# Patient Record
Sex: Male | Born: 1985 | Race: Black or African American | Hispanic: No | Marital: Single | Smoking: Current some day smoker
Health system: Southern US, Community
[De-identification: ages and names within clinical notes are randomized; demographics above are authoritative.]

## PROBLEM LIST (undated history)

## (undated) DIAGNOSIS — B20 Human immunodeficiency virus [HIV] disease: Secondary | ICD-10-CM

## (undated) HISTORY — PX: APPENDECTOMY: SHX54

---
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
## 2004-10-16 ENCOUNTER — Emergency Department (HOSPITAL_COMMUNITY): Admission: EM | Admit: 2004-10-16 | Discharge: 2004-10-16 | Payer: Self-pay | Admitting: Emergency Medicine

## 2006-02-11 ENCOUNTER — Encounter: Payer: Self-pay | Admitting: Emergency Medicine

## 2010-04-03 ENCOUNTER — Emergency Department (HOSPITAL_COMMUNITY): Admission: EM | Admit: 2010-04-03 | Discharge: 2010-04-03 | Payer: Self-pay | Admitting: Emergency Medicine

## 2010-08-29 ENCOUNTER — Emergency Department (HOSPITAL_COMMUNITY): Admission: EM | Admit: 2010-08-29 | Discharge: 2010-08-29 | Payer: Self-pay | Admitting: Emergency Medicine

## 2010-12-24 LAB — URINE MICROSCOPIC-ADD ON

## 2010-12-24 LAB — URINALYSIS, ROUTINE W REFLEX MICROSCOPIC
Bilirubin Urine: NEGATIVE
Glucose, UA: NEGATIVE mg/dL
Ketones, ur: NEGATIVE mg/dL
Nitrite: NEGATIVE
Protein, ur: NEGATIVE mg/dL
Specific Gravity, Urine: 1.02 (ref 1.005–1.030)
Urobilinogen, UA: 1 mg/dL (ref 0.0–1.0)
pH: 6 (ref 5.0–8.0)

## 2010-12-24 LAB — GC/CHLAMYDIA PROBE AMP, GENITAL
Chlamydia, DNA Probe: NEGATIVE
GC Probe Amp, Genital: POSITIVE — AB

## 2011-01-24 ENCOUNTER — Emergency Department (HOSPITAL_COMMUNITY)
Admission: EM | Admit: 2011-01-24 | Discharge: 2011-01-24 | Disposition: A | Discharge status: Home / Self Care | Payer: Self-pay | Attending: Emergency Medicine | Admitting: Emergency Medicine

## 2011-01-24 DIAGNOSIS — R110 Nausea: Secondary | ICD-10-CM | POA: Insufficient documentation

## 2011-01-24 DIAGNOSIS — R109 Unspecified abdominal pain: Secondary | ICD-10-CM | POA: Insufficient documentation

## 2011-01-24 DIAGNOSIS — R197 Diarrhea, unspecified: Secondary | ICD-10-CM | POA: Insufficient documentation

## 2011-01-24 LAB — DIFFERENTIAL
Basophils Absolute: 0 10*3/uL (ref 0.0–0.1)
Basophils Relative: 0 % (ref 0–1)
Eosinophils Absolute: 0.1 10*3/uL (ref 0.0–0.7)
Eosinophils Relative: 2 % (ref 0–5)
Lymphocytes Relative: 30 % (ref 12–46)
Lymphs Abs: 2.4 10*3/uL (ref 0.7–4.0)
Monocytes Absolute: 0.8 10*3/uL (ref 0.1–1.0)
Monocytes Relative: 10 % (ref 3–12)
Neutro Abs: 4.5 10*3/uL (ref 1.7–7.7)
Neutrophils Relative %: 58 % (ref 43–77)

## 2011-01-24 LAB — CBC
HCT: 45 % (ref 39.0–52.0)
Hemoglobin: 15.4 g/dL (ref 13.0–17.0)
MCH: 27.6 pg (ref 26.0–34.0)
MCHC: 34.2 g/dL (ref 30.0–36.0)
MCV: 80.6 fL (ref 78.0–100.0)
Platelets: 179 10*3/uL (ref 150–400)
RBC: 5.58 MIL/uL (ref 4.22–5.81)
RDW: 13.8 % (ref 11.5–15.5)
WBC: 7.8 10*3/uL (ref 4.0–10.5)

## 2011-01-24 LAB — URINALYSIS, ROUTINE W REFLEX MICROSCOPIC
Bilirubin Urine: NEGATIVE
Glucose, UA: NEGATIVE mg/dL
Hgb urine dipstick: NEGATIVE
Ketones, ur: NEGATIVE mg/dL
Nitrite: NEGATIVE
Protein, ur: NEGATIVE mg/dL
Specific Gravity, Urine: 1.007 (ref 1.005–1.030)
Urobilinogen, UA: 0.2 mg/dL (ref 0.0–1.0)
pH: 6 (ref 5.0–8.0)

## 2011-01-24 LAB — COMPREHENSIVE METABOLIC PANEL
ALT: 15 U/L (ref 0–53)
AST: 20 U/L (ref 0–37)
Albumin: 4.1 g/dL (ref 3.5–5.2)
Alkaline Phosphatase: 65 U/L (ref 39–117)
BUN: 10 mg/dL (ref 6–23)
CO2: 23 mEq/L (ref 19–32)
Calcium: 8.9 mg/dL (ref 8.4–10.5)
Chloride: 103 mEq/L (ref 96–112)
Creatinine, Ser: 0.95 mg/dL (ref 0.4–1.5)
GFR calc Af Amer: >60 mL/min (ref >60)
GFR calc non Af Amer: >60 mL/min (ref >60)
Glucose, Bld: 88 mg/dL (ref 70–99)
Potassium: 3.3 mEq/L — ABNORMAL LOW (ref 3.5–5.1)
Sodium: 137 mEq/L (ref 135–145)
Total Bilirubin: 0.9 mg/dL (ref 0.3–1.2)
Total Protein: 7.2 g/dL (ref 6.0–8.3)

## 2011-01-24 LAB — LIPASE, BLOOD: Lipase: 30 U/L (ref 11–59)

## 2011-03-02 ENCOUNTER — Emergency Department (HOSPITAL_COMMUNITY): Payer: Self-pay

## 2011-03-02 ENCOUNTER — Emergency Department (HOSPITAL_COMMUNITY)
Admission: EM | Admit: 2011-03-02 | Discharge: 2011-03-02 | Disposition: A | Discharge status: Home / Self Care | Payer: Self-pay | Attending: Emergency Medicine | Admitting: Emergency Medicine

## 2011-03-02 DIAGNOSIS — J45909 Unspecified asthma, uncomplicated: Secondary | ICD-10-CM | POA: Insufficient documentation

## 2011-03-02 DIAGNOSIS — M545 Low back pain, unspecified: Secondary | ICD-10-CM | POA: Insufficient documentation

## 2011-03-02 DIAGNOSIS — S5010XA Contusion of unspecified forearm, initial encounter: Secondary | ICD-10-CM | POA: Insufficient documentation

## 2011-03-02 DIAGNOSIS — M79609 Pain in unspecified limb: Secondary | ICD-10-CM | POA: Insufficient documentation

## 2011-03-02 DIAGNOSIS — IMO0002 Reserved for concepts with insufficient information to code with codable children: Secondary | ICD-10-CM | POA: Insufficient documentation

## 2011-03-02 DIAGNOSIS — S8000XA Contusion of unspecified knee, initial encounter: Secondary | ICD-10-CM | POA: Insufficient documentation

## 2011-07-15 ENCOUNTER — Emergency Department (HOSPITAL_COMMUNITY)
Admission: EM | Admit: 2011-07-15 | Discharge: 2011-07-15 | Disposition: A | Discharge status: Home / Self Care | Payer: Self-pay | Attending: Emergency Medicine | Admitting: Emergency Medicine

## 2011-07-15 DIAGNOSIS — R51 Headache: Secondary | ICD-10-CM | POA: Insufficient documentation

## 2011-07-15 DIAGNOSIS — J3489 Other specified disorders of nose and nasal sinuses: Secondary | ICD-10-CM | POA: Insufficient documentation

## 2011-07-15 DIAGNOSIS — H9209 Otalgia, unspecified ear: Secondary | ICD-10-CM | POA: Insufficient documentation

## 2011-07-15 DIAGNOSIS — R059 Cough, unspecified: Secondary | ICD-10-CM | POA: Insufficient documentation

## 2011-07-15 DIAGNOSIS — J45909 Unspecified asthma, uncomplicated: Secondary | ICD-10-CM | POA: Insufficient documentation

## 2011-07-15 DIAGNOSIS — R05 Cough: Secondary | ICD-10-CM | POA: Insufficient documentation

## 2011-07-15 DIAGNOSIS — J069 Acute upper respiratory infection, unspecified: Secondary | ICD-10-CM | POA: Insufficient documentation

## 2011-07-15 DIAGNOSIS — R079 Chest pain, unspecified: Secondary | ICD-10-CM | POA: Insufficient documentation

## 2011-11-17 ENCOUNTER — Emergency Department (HOSPITAL_COMMUNITY): Payer: Self-pay

## 2011-11-17 ENCOUNTER — Encounter (HOSPITAL_COMMUNITY): Payer: Self-pay | Admitting: *Deleted

## 2011-11-17 ENCOUNTER — Emergency Department (HOSPITAL_COMMUNITY)
Admission: EM | Admit: 2011-11-17 | Discharge: 2011-11-17 | Disposition: A | Discharge status: Home / Self Care | Payer: Self-pay | Attending: Emergency Medicine | Admitting: Emergency Medicine

## 2011-11-17 DIAGNOSIS — R109 Unspecified abdominal pain: Secondary | ICD-10-CM

## 2011-11-17 DIAGNOSIS — R1013 Epigastric pain: Secondary | ICD-10-CM | POA: Insufficient documentation

## 2011-11-17 DIAGNOSIS — J45909 Unspecified asthma, uncomplicated: Secondary | ICD-10-CM | POA: Insufficient documentation

## 2011-11-17 LAB — POCT I-STAT, CHEM 8
BUN: 14 mg/dL (ref 6–23)
Calcium, Ion: 1.21 mmol/L (ref 1.12–1.32)
Chloride: 106 mEq/L (ref 96–112)
Creatinine, Ser: 1 mg/dL (ref 0.50–1.35)
Glucose, Bld: 96 mg/dL (ref 70–99)
HCT: 48 % (ref 39.0–52.0)
Hemoglobin: 16.3 g/dL (ref 13.0–17.0)
Potassium: 3.9 mEq/L (ref 3.5–5.1)
Sodium: 142 mEq/L (ref 135–145)
TCO2: 26 mmol/L (ref 0–100)

## 2011-11-17 MED ORDER — IOHEXOL 300 MG/ML  SOLN
40.0000 mL | Freq: Once | INTRAMUSCULAR | Status: AC | PRN
  Administered 2011-11-17: 40 mL via ORAL

## 2011-11-17 MED ORDER — MORPHINE SULFATE 4 MG/ML IJ SOLN
4.0000 mg | Freq: Once | INTRAMUSCULAR | Status: AC
  Administered 2011-11-17: 4 mg via INTRAVENOUS
  Filled 2011-11-17: qty 1

## 2011-11-17 MED ORDER — IOHEXOL 300 MG/ML  SOLN
100.0000 mL | Freq: Once | INTRAMUSCULAR | Status: AC | PRN
  Administered 2011-11-17: 100 mL via INTRAVENOUS

## 2011-11-17 MED ORDER — SODIUM CHLORIDE 0.9 % IV SOLN
INTRAVENOUS | Status: DC
  Administered 2011-11-17: 1000 mL via INTRAVENOUS

## 2011-11-17 MED ORDER — GI COCKTAIL ~~LOC~~
30.0000 mL | Freq: Once | ORAL | Status: AC
  Administered 2011-11-17: 30 mL via ORAL
  Filled 2011-11-17: qty 30

## 2011-11-17 NOTE — ED Notes (Signed)
Pt complaint of abdominal pulsations, dizziness. Pt states that symptoms started at 6 pm after eating chicken and drinking ETOH.

## 2011-11-17 NOTE — ED Notes (Signed)
Generalized abd pain since 1800 getting worse

## 2011-11-17 NOTE — ED Provider Notes (Signed)
History     CSN: [id number]  Arrival date & time 11/17/11  0203   First MD Initiated Contact with Patient 11/17/11 [phone]      Chief Complaint  Patient presents with  . Abdominal Pain    (Consider location/radiation/quality/duration/timing/severity/associated sxs/prior treatment) HPI Complains of abdominal pain midepigastric described as pulsating onset 6 PM tonight after drinking beer no vomiting no nausea pain is mild at present has improved with time since being in the emergency department last bowel movement 2 days ago normal no treatment prior to coming here nothing makes symptoms better or worse. Pain is nonradiating described as "pulsatile" Past Medical History  Diagnosis Date  . Asthma     History reviewed. No pertinent past surgical history. Past surgical history appendectomy History reviewed. No pertinent family history.  History  Substance Use Topics  . Smoking status: Current Everyday Smoker  . Smokeless tobacco: Not on file  . Alcohol Use: Yes      Review of Systems  Constitutional: Negative.   HENT: Negative.   Respiratory: Negative.   Cardiovascular: Negative.   Gastrointestinal: Positive for abdominal pain.  Musculoskeletal: Negative.   Skin: Negative.   Neurological: Negative.   Hematological: Negative.   Psychiatric/Behavioral: Negative.     Allergies  Aspirin  Home Medications   Current Outpatient Rx  Name Route Sig Dispense Refill  . GOODY HEADACHE PO Oral Take 1 Package by mouth daily as needed. For headache      BP 143/85  Pulse 99  Temp(Src) 98.1 F (36.7 C) (Oral)  Resp 21  SpO2 100%  Physical Exam  Nursing note and vitals reviewed. Constitutional: He appears well-developed and well-nourished.  HENT:  Head: Normocephalic and atraumatic.  Eyes: Conjunctivae are normal. Pupils are equal, round, and reactive to light.  Neck: Neck supple. No tracheal deviation present. No thyromegaly present.  Cardiovascular: Normal rate and  regular rhythm.   No murmur heard. Pulmonary/Chest: Effort normal and breath sounds normal.  Abdominal: Soft. Bowel sounds are normal. He exhibits no distension. There is tenderness.       Tender at epigastrium no guarding rigidity or rebound normal active bowel sounds  Musculoskeletal: Normal range of motion. He exhibits no edema and no tenderness.  Neurological: He is alert. Coordination normal.  Skin: Skin is warm and dry. No rash noted.  Psychiatric: He has a normal mood and affect.   Results for orders placed during the hospital encounter of 11/17/11  POCT I-STAT, CHEM 8      Component Value Range   Sodium 142  135 - 145 (mEq/L)   Potassium 3.9  3.5 - 5.1 (mEq/L)   Chloride 106  96 - 112 (mEq/L)   BUN 14  6 - 23 (mg/dL)   Creatinine, Ser 1.19  0.50 - 1.35 (mg/dL)   Glucose, Bld 96  70 - 99 (mg/dL)   Calcium, Ion 1.47  8.29 - 1.32 (mmol/L)   TCO2 26  0 - 100 (mmol/L)   Hemoglobin 16.3  13.0 - 17.0 (g/dL)   HCT 56.2  13.0 - 86.5 (%)   Ct Abdomen Pelvis W Contrast  11/17/2011  *RADIOLOGY REPORT*  Clinical Data: Abdominal pain  CT ABDOMEN AND PELVIS WITH CONTRAST  Technique:  Multidetector CT imaging of the abdomen and pelvis was performed following the standard protocol during bolus administration of intravenous contrast.  Contrast: 40mL OMNIPAQUE IOHEXOL 300 MG/ML IV SOLN, OMNIPAQUE IOHEXOL 300 MG/ML IV SOLN  Comparison: None.  Findings: Limited images through the lung bases  demonstrate no significant appreciable abnormality. The heart size is within normal limits. No pleural or pericardial effusion.  Unremarkable liver, biliary system, spleen, pancreas, adrenal glands, kidneys.  No hydronephrosis or hydroureter.  No bowel obstruction.  No CT evidence for colitis.  Appendix not identified with suggestion of right lower quadrant clips.  No free intraperitoneal air or fluid.  No lymphadenopathy.  Partially decompressed bladder.  Normal caliber vasculature.  No acute osseous  abnormality.  IMPRESSION: No acute abnormality identified by CT.  Appendix not identified however no inflammation in the right lower quadrant.  Original Report Authenticated By: Waneta Martins, M.D.     ED Course  Procedures (including critical care time) Declines pain medicine Labs Reviewed - No data to display No results found.  4:28 AM requesting pain medicine states pain is slightly worse morphine ordered by me. 7:15 AM patient received little relief from morphine. He got significant relief from GI cocktail. No diagnosis found.    MDM  Assessment patient suffers from chronic abdominal pain etiology may be gastritis versus peptic ulcer disease versus GERD. Suggest Prilosec OTC, antacids, we'll do GI referral Diagnosis abdominal pain.        Doug Sou, MD 11/17/11 [phone]

## 2013-01-27 ENCOUNTER — Emergency Department (HOSPITAL_COMMUNITY)
Admission: EM | Admit: 2013-01-27 | Discharge: 2013-01-27 | Disposition: A | Discharge status: Home / Self Care | Payer: Self-pay | Attending: Emergency Medicine | Admitting: Emergency Medicine

## 2013-01-27 ENCOUNTER — Emergency Department (HOSPITAL_COMMUNITY): Payer: Self-pay

## 2013-01-27 ENCOUNTER — Encounter (HOSPITAL_COMMUNITY): Payer: Self-pay | Admitting: Emergency Medicine

## 2013-01-27 DIAGNOSIS — F17200 Nicotine dependence, unspecified, uncomplicated: Secondary | ICD-10-CM | POA: Insufficient documentation

## 2013-01-27 DIAGNOSIS — K5900 Constipation, unspecified: Secondary | ICD-10-CM | POA: Insufficient documentation

## 2013-01-27 DIAGNOSIS — R110 Nausea: Secondary | ICD-10-CM | POA: Insufficient documentation

## 2013-01-27 DIAGNOSIS — J45909 Unspecified asthma, uncomplicated: Secondary | ICD-10-CM | POA: Insufficient documentation

## 2013-01-27 DIAGNOSIS — K6289 Other specified diseases of anus and rectum: Secondary | ICD-10-CM | POA: Insufficient documentation

## 2013-01-27 DIAGNOSIS — R61 Generalized hyperhidrosis: Secondary | ICD-10-CM | POA: Insufficient documentation

## 2013-01-27 DIAGNOSIS — M4800 Spinal stenosis, site unspecified: Secondary | ICD-10-CM | POA: Insufficient documentation

## 2013-01-27 DIAGNOSIS — N509 Disorder of male genital organs, unspecified: Secondary | ICD-10-CM | POA: Insufficient documentation

## 2013-01-27 LAB — COMPREHENSIVE METABOLIC PANEL
ALT: 22 U/L (ref 0–53)
AST: 18 U/L (ref 0–37)
Albumin: 4.1 g/dL (ref 3.5–5.2)
Alkaline Phosphatase: 71 U/L (ref 39–117)
BUN: 13 mg/dL (ref 6–23)
CO2: 24 mEq/L (ref 19–32)
Calcium: 9.5 mg/dL (ref 8.4–10.5)
Chloride: 102 mEq/L (ref 96–112)
Creatinine, Ser: 0.91 mg/dL (ref 0.50–1.35)
GFR calc Af Amer: >90 mL/min (ref >90)
GFR calc non Af Amer: >90 mL/min (ref >90)
Glucose, Bld: 94 mg/dL (ref 70–99)
Potassium: 3.6 mEq/L (ref 3.5–5.1)
Sodium: 135 mEq/L (ref 135–145)
Total Bilirubin: 0.4 mg/dL (ref 0.3–1.2)
Total Protein: 7.3 g/dL (ref 6.0–8.3)

## 2013-01-27 LAB — URINALYSIS, MICROSCOPIC ONLY
Bilirubin Urine: NEGATIVE
Glucose, UA: NEGATIVE mg/dL
Hgb urine dipstick: NEGATIVE
Ketones, ur: NEGATIVE mg/dL
Leukocytes, UA: NEGATIVE
Nitrite: NEGATIVE
Protein, ur: NEGATIVE mg/dL
Specific Gravity, Urine: 1.02 (ref 1.005–1.030)
Urobilinogen, UA: 0.2 mg/dL (ref 0.0–1.0)
pH: 6 (ref 5.0–8.0)

## 2013-01-27 LAB — CBC WITH DIFFERENTIAL/PLATELET
Basophils Absolute: 0 10*3/uL (ref 0.0–0.1)
Basophils Relative: 0 % (ref 0–1)
Eosinophils Absolute: 0.1 10*3/uL (ref 0.0–0.7)
Eosinophils Relative: 1 % (ref 0–5)
HCT: 46.6 % (ref 39.0–52.0)
Hemoglobin: 16.2 g/dL (ref 13.0–17.0)
Lymphocytes Relative: 24 % (ref 12–46)
Lymphs Abs: 2.9 10*3/uL (ref 0.7–4.0)
MCH: 27.6 pg (ref 26.0–34.0)
MCHC: 34.8 g/dL (ref 30.0–36.0)
MCV: 79.5 fL (ref 78.0–100.0)
Monocytes Absolute: 0.7 10*3/uL (ref 0.1–1.0)
Monocytes Relative: 6 % (ref 3–12)
Neutro Abs: 8.1 10*3/uL — ABNORMAL HIGH (ref 1.7–7.7)
Neutrophils Relative %: 69 % (ref 43–77)
Platelets: 206 10*3/uL (ref 150–400)
RBC: 5.86 MIL/uL — ABNORMAL HIGH (ref 4.22–5.81)
RDW: 13.6 % (ref 11.5–15.5)
WBC: 11.8 10*3/uL — ABNORMAL HIGH (ref 4.0–10.5)

## 2013-01-27 LAB — LIPASE, BLOOD: Lipase: 24 U/L (ref 11–59)

## 2013-01-27 MED ORDER — PROMETHAZINE HCL 25 MG PO TABS: 25.0000 mg | ORAL_TABLET | Freq: Three times a day (TID) | ORAL | Status: DC | PRN

## 2013-01-27 MED ORDER — CIPROFLOXACIN HCL 500 MG PO TABS: 500.0000 mg | ORAL_TABLET | Freq: Two times a day (BID) | ORAL | Status: DC

## 2013-01-27 MED ORDER — HYDROMORPHONE HCL PF 1 MG/ML IJ SOLN
1.0000 mg | Freq: Once | INTRAMUSCULAR | Status: AC
  Administered 2013-01-27: 1 mg via INTRAVENOUS
  Filled 2013-01-27: qty 1

## 2013-01-27 MED ORDER — IOHEXOL 300 MG/ML  SOLN
100.0000 mL | Freq: Once | INTRAMUSCULAR | Status: AC | PRN
  Administered 2013-01-27: 100 mL via INTRAVENOUS

## 2013-01-27 MED ORDER — SENNOSIDES-DOCUSATE SODIUM 8.6-50 MG PO TABS: 1.0000 | ORAL_TABLET | Freq: Every day | ORAL | Status: DC

## 2013-01-27 MED ORDER — ONDANSETRON HCL 4 MG/2ML IJ SOLN
4.0000 mg | Freq: Once | INTRAMUSCULAR | Status: AC
  Administered 2013-01-27: 4 mg via INTRAVENOUS
  Filled 2013-01-27: qty 2

## 2013-01-27 MED ORDER — OXYCODONE-ACETAMINOPHEN 5-325 MG PO TABS: 2.0000 | ORAL_TABLET | ORAL | Status: DC | PRN

## 2013-01-27 MED ORDER — IOHEXOL 300 MG/ML  SOLN
50.0000 mL | Freq: Once | INTRAMUSCULAR | Status: AC | PRN
  Administered 2013-01-27: 50 mL via ORAL

## 2013-01-27 MED ORDER — METRONIDAZOLE 500 MG PO TABS: 500.0000 mg | ORAL_TABLET | Freq: Three times a day (TID) | ORAL | Status: DC

## 2013-01-27 NOTE — ED Notes (Signed)
Patient transported to CT 

## 2013-01-27 NOTE — ED Notes (Signed)
States that he has been having abd pain for the past 2 days. States that he had diarrhea 3 days ago then his abd began to hurt. States that he has not had a BM in the last 2 days which is not normal for him.

## 2013-01-27 NOTE — ED Provider Notes (Signed)
History     CSN: [id number]  Arrival date & time 01/27/13  1008   First MD Initiated Contact with Patient 01/27/13 1050      Chief Complaint  Patient presents with  . Abdominal Pain    (Consider location/radiation/quality/duration/timing/severity/associated sxs/prior treatment) HPI Comments: Patient reports periumbilical and rectal pain, sharp in nature, worse with BM.  Also has bilateral testicular pain with attempt at bowel movements.  Associated nausea, no bowel movements.  States this feels like previous appendicitis.  Denies urinary symptoms, penile discharge, scrotal swelling.  Denies bloody stool or diarrhea.  Pt has had episodes like this for the past 4-5 years, lasting 1 week at a time.  No family hx inflammatory bowel disease.  Past abdominal surgeries: appendectomy.    The history is provided by the patient.    Past Medical History  Diagnosis Date  . Asthma     History reviewed. No pertinent past surgical history.  No family history on file.  History  Substance Use Topics  . Smoking status: Current Every Day Smoker  . Smokeless tobacco: Not on file  . Alcohol Use: Yes      Review of Systems  Constitutional: Positive for chills and diaphoresis.  HENT: Negative for sore throat.   Respiratory: Negative for cough and shortness of breath.   Cardiovascular: Negative for chest pain.  Gastrointestinal: Positive for nausea, abdominal pain and constipation. Negative for vomiting, blood in stool and anal bleeding.  Genitourinary: Positive for testicular pain. Negative for dysuria, urgency, frequency and scrotal swelling.  Musculoskeletal: Positive for back pain.    Allergies  Aspirin  Home Medications   Current Outpatient Rx  Name  Route  Sig  Dispense  Refill  . dicyclomine (BENTYL) 10 MG capsule   Oral   Take 10 mg by mouth 4 (four) times daily -  before meals and at bedtime.           BP 147/92  Pulse 81  Temp(Src) 98 F (36.7 C) (Oral)  Resp 15   SpO2 100%  Physical Exam  Nursing note and vitals reviewed. Constitutional: He appears well-developed and well-nourished. No distress.  HENT:  Head: Normocephalic and atraumatic.  Neck: Neck supple.  Cardiovascular: Normal rate and regular rhythm.   Pulmonary/Chest: Effort normal and breath sounds normal. No respiratory distress. He has no wheezes. He has no rales.  Abdominal: Soft. Bowel sounds are normal. He exhibits no distension and no mass. There is tenderness. There is no rebound and no guarding.  Genitourinary: Testes normal. Rectal exam shows internal hemorrhoid and tenderness. Rectal exam shows anal tone normal. Right testis shows no swelling and no tenderness. Left testis shows no swelling and no tenderness. Circumcised.  Pt very uncomfortable on rectal exam, unable to reach prostate secondary to patient's resistance.   Neurological: He is alert. He exhibits normal muscle tone.  Skin: He is not diaphoretic.    ED Course  Procedures (including critical care time)  Labs Reviewed  CBC WITH DIFFERENTIAL - Abnormal; Notable for the following:    WBC 11.8 (*)    RBC 5.86 (*)    Neutro Abs 8.1 (*)    All other components within normal limits  COMPREHENSIVE METABOLIC PANEL  LIPASE, BLOOD  URINALYSIS, MICROSCOPIC ONLY   Ct Abdomen Pelvis W Contrast  01/27/2013  *RADIOLOGY REPORT*  Clinical Data: Periumbilical pain.  Nausea vomiting.  Rectal pressure with bowel movement.  Prior appendectomy.  CT ABDOMEN AND PELVIS WITH CONTRAST  Technique:  Multidetector CT  imaging of the abdomen and pelvis was performed following the standard protocol during bolus administration of intravenous contrast.  Contrast: OMNIPAQUE IOHEXOL 300 MG/ML  SOLN  Comparison: 11/17/2011.  Findings: Lung bases clear.  Mild thickening of the rectosigmoid region.  Although this may be related to under distension, mild proctitis type changes could also cause a similar appearance.  There is no evidence of  extraluminal bowel inflammatory process, free fluid or free air.  Post appendectomy with surgical clips in place.  No focal worrisome hepatic, splenic, pancreatic, adrenal or renal lesion.  No calcified gallstones.  No abdominal aortic aneurysm.  No adenopathy.  No bony destructive lesion.  L4-5 and L5-S1 disc disease with various degrees spinal stenosis and foraminal narrowing.  Noncontrast filled views of the urinary bladder unremarkable.  IMPRESSION: Mild thickening of the rectosigmoid region.  Although this may be related to under distension, mild proctitis type changes could also cause a similar appearance.  There is no evidence of extraluminal bowel inflammatory process, free fluid or free air.  Post appendectomy with surgical clips in place.   Original Report Authenticated By: Lacy Duverney, M.D.     2:14 PM Discussed results with patient.  Pt denies anal intercourse, denies recent GC/Chlam infection.   1. Proctitis   2. Spinal stenosis      MDM  Pt with abdominal and rectal pain x 3 days.  WBC slightly elevated, CT shows proctitis.  Pt denies risk factors for anal GC/chlam infection.  Pt d/c home with cipro, flagyl, pain and nausea medication, stool softener, GI follow up.  Discussed all results with patient.  Pt given return precautions.  Pt verbalizes understanding and agrees with plan.           Trixie Dredge, PA-C 01/27/13 1617

## 2013-01-27 NOTE — Progress Notes (Signed)
During [facility] ED 01/27/13 visit CM consulted by ED RN, Gabriel Earing about pt's inquiry about an "orange card" CM spoke with pt who confirms self pay [facility] resident with no pcp. CM provided pt with contact number for Ewell Poe at [facility] for community care network [phone] to request an appointment. CM discussed and provided written information for self pay pcps, importance of pcp for f/u care, www.needymeds.org, discounted pharmacies and other [place] resources such as financial assistance, DSS and  health department Reviewed Health connect number to assist with finding self pay provider close to pt's residence. Reviewed re sources for [place] self pay pcps like [facility], family medicine at [address], [facility] family practice, general medical clinics, [facility] urgent care plus others, [facility] out patient pharmacies and housing Pt voiced understanding and appreciation of resources provided

## 2013-01-28 NOTE — ED Provider Notes (Signed)
Medical screening examination/treatment/procedure(s) were performed by non-physician practitioner and as supervising physician I was immediately available for consultation/collaboration.  Avielle Imbert T Truett Mcfarlan, MD 01/28/13 0820 

## 2013-10-16 ENCOUNTER — Emergency Department (HOSPITAL_COMMUNITY)
Admission: EM | Admit: 2013-10-16 | Discharge: 2013-10-16 | Disposition: A | Discharge status: Home / Self Care | Payer: Self-pay | Attending: Emergency Medicine | Admitting: Emergency Medicine

## 2013-10-16 ENCOUNTER — Encounter (HOSPITAL_COMMUNITY): Payer: Self-pay | Admitting: Emergency Medicine

## 2013-10-16 DIAGNOSIS — Z7982 Long term (current) use of aspirin: Secondary | ICD-10-CM | POA: Insufficient documentation

## 2013-10-16 DIAGNOSIS — J111 Influenza due to unidentified influenza virus with other respiratory manifestations: Secondary | ICD-10-CM | POA: Insufficient documentation

## 2013-10-16 DIAGNOSIS — R112 Nausea with vomiting, unspecified: Secondary | ICD-10-CM | POA: Insufficient documentation

## 2013-10-16 DIAGNOSIS — J45909 Unspecified asthma, uncomplicated: Secondary | ICD-10-CM | POA: Insufficient documentation

## 2013-10-16 DIAGNOSIS — Z87891 Personal history of nicotine dependence: Secondary | ICD-10-CM | POA: Insufficient documentation

## 2013-10-16 DIAGNOSIS — R000 Tachycardia, unspecified: Secondary | ICD-10-CM | POA: Insufficient documentation

## 2013-10-16 MED ORDER — IBUPROFEN 400 MG PO TABS
600.0000 mg | ORAL_TABLET | Freq: Once | ORAL | Status: AC
  Administered 2013-10-16: 600 mg via ORAL
  Filled 2013-10-16 (×2): qty 1

## 2013-10-16 MED ORDER — ONDANSETRON HCL 4 MG PO TABS: 4.0000 mg | ORAL_TABLET | Freq: Three times a day (TID) | ORAL | Status: DC | PRN

## 2013-10-16 MED ORDER — ACETAMINOPHEN 325 MG PO TABS
650.0000 mg | ORAL_TABLET | Freq: Once | ORAL | Status: AC
  Administered 2013-10-16: 650 mg via ORAL
  Filled 2013-10-16: qty 2

## 2013-10-16 MED ORDER — ONDANSETRON 4 MG PO TBDP
4.0000 mg | ORAL_TABLET | Freq: Once | ORAL | Status: AC
  Administered 2013-10-16: 4 mg via ORAL
  Filled 2013-10-16: qty 1

## 2013-10-16 NOTE — ED Notes (Signed)
C/o fever, chills, n/v x 2 days. Denies cough

## 2013-10-16 NOTE — ED Notes (Signed)
Pt c/o hot and cold chills. Fever, but did not take temperature at home, headache, low back pain and n/v x 2 days.

## 2013-10-16 NOTE — ED Provider Notes (Addendum)
CSN: [id number]     Arrival date & time 10/16/13  1117 History   First MD Initiated Contact with Patient 10/16/13 1139     Chief Complaint  Patient presents with  . Fever  . Headache  . Nausea  . Emesis   (Consider location/radiation/quality/duration/timing/severity/associated sxs/prior Treatment) Patient is a 28 y.o. male presenting with fever, headaches, and vomiting. The history is provided by the patient.  Fever Temp source:  Subjective Severity:  Moderate Onset quality:  Gradual Duration:  2 days Timing:  Constant Progression:  Unchanged Chronicity:  New Relieved by:  None tried Worsened by:  Nothing tried Ineffective treatments:  None tried Associated symptoms: chills, cough, headaches, myalgias, rhinorrhea, sore throat and vomiting   Associated symptoms: no chest pain and no diarrhea   Associated symptoms comment:  No SOB.  No neck pain.  Only 1 episode of vomiting last night which was all mucous Risk factors: no hx of cancer, no immunosuppression, no recent surgery and no recent travel   Headache Associated symptoms: cough, fever, myalgias, sore throat and vomiting   Associated symptoms: no diarrhea and no neck pain   Emesis Associated symptoms: chills, headaches, myalgias and sore throat   Associated symptoms: no diarrhea     Past Medical History  Diagnosis Date  . Asthma    Past Surgical History  Procedure Laterality Date  . Appendectomy     No family history on file. History  Substance Use Topics  . Smoking status: Former Smoker    Quit date: 10/13/2013  . Smokeless tobacco: Not on file  . Alcohol Use: Yes    Review of Systems  Constitutional: Positive for fever and chills.  HENT: Positive for rhinorrhea and sore throat.   Respiratory: Positive for cough.   Cardiovascular: Negative for chest pain.  Gastrointestinal: Positive for vomiting. Negative for diarrhea.  Musculoskeletal: Positive for myalgias. Negative for neck pain.  Neurological: Positive  for headaches.  All other systems reviewed and are negative.    Allergies  Aspirin  Home Medications   Current Outpatient Rx  Name  Route  Sig  Dispense  Refill  . aspirin 325 MG tablet   Oral   Take 325 mg by mouth once.         . Cyanocobalamin (B-12 PO)   Oral   Take 1 tablet by mouth once.          BP 135/78  Pulse 104  Temp(Src) 99.2 F (37.3 C) (Oral)  Resp 22  Ht 5\' 10"  (1.778 m)  Wt 176 lb (79.833 kg)  BMI 25.25 kg/m2  SpO2 100% Physical Exam  Nursing note and vitals reviewed. Constitutional: He is oriented to person, place, and time. He appears well-developed and well-nourished. No distress.  HENT:  Head: Normocephalic and atraumatic.  Right Ear: Tympanic membrane and ear canal normal.  Left Ear: Tympanic membrane and ear canal normal.  Nose: Mucosal edema and rhinorrhea present.  Mouth/Throat: Posterior oropharyngeal erythema present. No oropharyngeal exudate or posterior oropharyngeal edema.  Eyes: Conjunctivae and EOM are normal. Pupils are equal, round, and reactive to light.  photosensitive  Neck: Normal range of motion. Neck supple. No spinous process tenderness and no muscular tenderness present. No Brudzinski's sign and no Kernig's sign noted.  Cardiovascular: Regular rhythm and intact distal pulses.  Tachycardia present.   No murmur heard. Pulmonary/Chest: Effort normal and breath sounds normal. No respiratory distress. He has no wheezes. He has no rales.  Abdominal: Soft. He exhibits no distension. There  is no tenderness. There is no rebound and no guarding.  Musculoskeletal: Normal range of motion. He exhibits no edema and no tenderness.  Neurological: He is alert and oriented to person, place, and time.  Skin: Skin is warm and dry. No rash noted. No erythema.  Psychiatric: He has a normal mood and affect. His behavior is normal.    ED Course  Procedures (including critical care time) Labs Review Labs Reviewed - No data to  display Imaging Review No results found.  EKG Interpretation   None       MDM   1. Influenza     Pt with symptoms consistent with influenza.  Normal exam here but is febrile.  No signs of breathing difficulty  No signs of strep pharyngitis, otitis or abnormal abdominal findings.  Pt does have hx of asthma but no wheezing here and has inhaler at home to use prn. Not a candidate for tamiflu.  Will continue antipyretica and rest and fluids and return for any further problems.     Gwyneth Sprout, MD 10/16/13 1206  Gwyneth Sprout, MD 10/16/13 1207

## 2013-10-18 ENCOUNTER — Encounter (HOSPITAL_COMMUNITY): Payer: Self-pay | Admitting: Emergency Medicine

## 2013-10-18 ENCOUNTER — Emergency Department (HOSPITAL_COMMUNITY): Payer: Self-pay

## 2013-10-18 ENCOUNTER — Emergency Department (HOSPITAL_COMMUNITY)
Admission: EM | Admit: 2013-10-18 | Discharge: 2013-10-18 | Disposition: A | Discharge status: Home / Self Care | Payer: Self-pay | Attending: Emergency Medicine | Admitting: Emergency Medicine

## 2013-10-18 DIAGNOSIS — R197 Diarrhea, unspecified: Secondary | ICD-10-CM | POA: Insufficient documentation

## 2013-10-18 DIAGNOSIS — IMO0001 Reserved for inherently not codable concepts without codable children: Secondary | ICD-10-CM | POA: Insufficient documentation

## 2013-10-18 DIAGNOSIS — R69 Illness, unspecified: Secondary | ICD-10-CM

## 2013-10-18 DIAGNOSIS — J45909 Unspecified asthma, uncomplicated: Secondary | ICD-10-CM | POA: Insufficient documentation

## 2013-10-18 DIAGNOSIS — R112 Nausea with vomiting, unspecified: Secondary | ICD-10-CM | POA: Insufficient documentation

## 2013-10-18 DIAGNOSIS — R5381 Other malaise: Secondary | ICD-10-CM | POA: Insufficient documentation

## 2013-10-18 DIAGNOSIS — R000 Tachycardia, unspecified: Secondary | ICD-10-CM | POA: Insufficient documentation

## 2013-10-18 DIAGNOSIS — J111 Influenza due to unidentified influenza virus with other respiratory manifestations: Secondary | ICD-10-CM | POA: Insufficient documentation

## 2013-10-18 DIAGNOSIS — Z87891 Personal history of nicotine dependence: Secondary | ICD-10-CM | POA: Insufficient documentation

## 2013-10-18 DIAGNOSIS — Z7982 Long term (current) use of aspirin: Secondary | ICD-10-CM | POA: Insufficient documentation

## 2013-10-18 DIAGNOSIS — Z79899 Other long term (current) drug therapy: Secondary | ICD-10-CM | POA: Insufficient documentation

## 2013-10-18 DIAGNOSIS — R5383 Other fatigue: Secondary | ICD-10-CM

## 2013-10-18 LAB — CBC WITH DIFFERENTIAL/PLATELET
Basophils Absolute: 0 10*3/uL (ref 0.0–0.1)
Basophils Relative: 0 % (ref 0–1)
Eosinophils Absolute: 0 10*3/uL (ref 0.0–0.7)
Eosinophils Relative: 0 % (ref 0–5)
HCT: 46.9 % (ref 39.0–52.0)
Hemoglobin: 16.3 g/dL (ref 13.0–17.0)
Lymphocytes Relative: 13 % (ref 12–46)
Lymphs Abs: 0.8 10*3/uL (ref 0.7–4.0)
MCH: 28.6 pg (ref 26.0–34.0)
MCHC: 34.8 g/dL (ref 30.0–36.0)
MCV: 82.3 fL (ref 78.0–100.0)
Monocytes Absolute: 0.4 10*3/uL (ref 0.1–1.0)
Monocytes Relative: 7 % (ref 3–12)
Neutro Abs: 4.7 10*3/uL (ref 1.7–7.7)
Neutrophils Relative %: 80 % — ABNORMAL HIGH (ref 43–77)
Platelets: 121 10*3/uL — ABNORMAL LOW (ref 150–400)
RBC: 5.7 MIL/uL (ref 4.22–5.81)
RDW: 14.4 % (ref 11.5–15.5)
WBC: 5.9 10*3/uL (ref 4.0–10.5)

## 2013-10-18 LAB — BASIC METABOLIC PANEL
BUN: 5 mg/dL — ABNORMAL LOW (ref 6–23)
CO2: 25 mEq/L (ref 19–32)
Calcium: 8.4 mg/dL (ref 8.4–10.5)
Chloride: 99 mEq/L (ref 96–112)
Creatinine, Ser: 1.13 mg/dL (ref 0.50–1.35)
GFR calc Af Amer: >90 mL/min (ref >90)
GFR calc non Af Amer: 87 mL/min — ABNORMAL LOW (ref >90)
Glucose, Bld: 105 mg/dL — ABNORMAL HIGH (ref 70–99)
Potassium: 3.8 mEq/L (ref 3.7–5.3)
Sodium: 138 mEq/L (ref 137–147)

## 2013-10-18 MED ORDER — HYDROCODONE-ACETAMINOPHEN 5-325 MG PO TABS: 2.0000 | ORAL_TABLET | ORAL | Status: DC | PRN

## 2013-10-18 MED ORDER — KETOROLAC TROMETHAMINE 60 MG/2ML IM SOLN
60.0000 mg | Freq: Once | INTRAMUSCULAR | Status: AC
  Administered 2013-10-18: 60 mg via INTRAMUSCULAR
  Filled 2013-10-18: qty 2

## 2013-10-18 MED ORDER — ACETAMINOPHEN 325 MG PO TABS
650.0000 mg | ORAL_TABLET | Freq: Once | ORAL | Status: AC
  Administered 2013-10-18: 650 mg via ORAL

## 2013-10-18 MED ORDER — PROMETHAZINE HCL 25 MG PO TABS: 25.0000 mg | ORAL_TABLET | Freq: Four times a day (QID) | ORAL | Status: DC | PRN

## 2013-10-18 MED ORDER — ONDANSETRON 4 MG PO TBDP
8.0000 mg | ORAL_TABLET | Freq: Once | ORAL | Status: AC
  Administered 2013-10-18: 8 mg via ORAL
  Filled 2013-10-18: qty 2

## 2013-10-18 NOTE — ED Notes (Signed)
Did not get zofran prescription filled.

## 2013-10-18 NOTE — ED Provider Notes (Signed)
CSN: [id number]     Arrival date & time 10/18/13  [id number] History   First MD Initiated Contact with Patient 10/18/13 0920     No chief complaint on file.  (Consider location/radiation/quality/duration/timing/severity/associated sxs/prior Treatment) HPI Comments: Patient is a 28 year old male who presents with a 4 day history of multiple symptoms including fever, chills, myalgias, generalized weakness and NVD. Symptoms started gradually and progressively worsened since the onset. Patient was seen in the ED 2 days ago and diagnosed with influenza. Patient was not a candidate for tamiflu at the time and was discharged with symptomatic treatment. Patient returned to the ED because he feels worse over the past 2 days. No other associated symptoms. No aggravating/alleviating factors.    Past Medical History  Diagnosis Date  . Asthma    Past Surgical History  Procedure Laterality Date  . Appendectomy     No family history on file. History  Substance Use Topics  . Smoking status: Former Smoker    Quit date: 10/13/2013  . Smokeless tobacco: Not on file  . Alcohol Use: Yes    Review of Systems  Constitutional: Positive for chills. Negative for fever and fatigue.  HENT: Negative for trouble swallowing.   Eyes: Negative for visual disturbance.  Respiratory: Negative for shortness of breath.   Cardiovascular: Negative for chest pain and palpitations.  Gastrointestinal: Positive for nausea, vomiting and diarrhea. Negative for abdominal pain.  Genitourinary: Negative for dysuria and difficulty urinating.  Musculoskeletal: Positive for myalgias. Negative for arthralgias and neck pain.  Skin: Negative for color change.  Neurological: Negative for dizziness and weakness.  Psychiatric/Behavioral: Negative for dysphoric mood.    Allergies  Aspirin  Home Medications   Current Outpatient Rx  Name  Route  Sig  Dispense  Refill  . aspirin 325 MG tablet   Oral   Take 325 mg by mouth once.          . Cyanocobalamin (B-12 PO)   Oral   Take 1 tablet by mouth once.         . ondansetron (ZOFRAN) 4 MG tablet   Oral   Take 1 tablet (4 mg total) by mouth every 8 (eight) hours as needed for nausea or vomiting.   10 tablet   0    BP 137/83  Pulse 100  Temp(Src) 100.4 F (38 C) (Oral)  Resp 20  SpO2 99% Physical Exam  Nursing note and vitals reviewed. Constitutional: He is oriented to person, place, and time. He appears well-developed and well-nourished. No distress.  HENT:  Head: Normocephalic and atraumatic.  Mouth/Throat: Oropharynx is clear and moist. No oropharyngeal exudate.  Eyes: Conjunctivae and EOM are normal. Pupils are equal, round, and reactive to light.  Neck: Normal range of motion.  Cardiovascular: Normal rate and regular rhythm.  Exam reveals no gallop and no friction rub.   No murmur heard. Pulmonary/Chest: Effort normal and breath sounds normal. He has no wheezes. He has no rales. He exhibits no tenderness.  Abdominal: Soft. He exhibits no distension. There is no tenderness. There is no rebound.  Musculoskeletal: Normal range of motion.  Neurological: He is alert and oriented to person, place, and time.  Speech is goal-oriented. Moves limbs without ataxia.   Skin: Skin is warm and dry.  Psychiatric: He has a normal mood and affect. His behavior is normal.    ED Course  Procedures (including critical care time) Labs Review Labs Reviewed  BASIC METABOLIC PANEL - Abnormal; Notable for the  following:    Glucose, Bld 105 (*)    BUN 5 (*)    GFR calc non Af Amer 87 (*)    All other components within normal limits  CBC WITH DIFFERENTIAL - Abnormal; Notable for the following:    Platelets 121 (*)    Neutrophils Relative % 80 (*)    All other components within normal limits   Imaging Review Dg Chest 2 View  10/18/2013   CLINICAL DATA:  Productive cough.  Fever.  EXAM: CHEST  2 VIEW  COMPARISON:  None.  FINDINGS: Heart size within normal limits.  No  infiltrate, congestive heart failure or pneumothorax.  IMPRESSION: No active cardiopulmonary disease.   Electronically Signed   By: Bridgett Larsson M.D.   On: 10/18/2013 10:16    EKG Interpretation   None       MDM   1. Influenza-like illness     9:45 AM Labs unremarkable for acute changes. Chest xray pending. Patient given tylenol, toradol IM, and zofran. Patient is febrile and tachycardic here. Patient diagnosed with influenza 2 days ago and likely having viral illness. Patient will be treated symptomatically.     Emilia Beck, PA-C 10/18/13 1514

## 2013-10-18 NOTE — ED Provider Notes (Signed)
Medical screening examination/treatment/procedure(s) were performed by non-physician practitioner and as supervising physician I was immediately available for consultation/collaboration.  Flint MelterElliott L Placida Cambre, MD 10/18/13 [phone]

## 2013-10-18 NOTE — ED Notes (Signed)
Patient transported to X-ray 

## 2013-10-18 NOTE — ED Notes (Signed)
Here 2d ago, dx'd with flu. Returns b/c he feels worse. "feel real terrible". C/o chills, bodyaches, fever, weak, nvd. Has had ibuprofen 800mg  (?mg) and tylenol 650mg  (?mg) together at 2345. Last emesis 2 hrs ago. Last BM 2300 (watery & soft).

## 2013-10-18 NOTE — Discharge Instructions (Signed)
Take Phenergan as needed for nausea. Take Vicodin as needed for pain. Refer to attached documents for more information. Get plenty of rest and drink plenty of fluids. Return to the ED with worsening or concerning symptoms.

## 2013-10-18 NOTE — ED Notes (Signed)
Patient transported back to room.

## 2013-10-22 ENCOUNTER — Encounter (HOSPITAL_COMMUNITY): Payer: Self-pay | Admitting: Emergency Medicine

## 2013-10-22 ENCOUNTER — Emergency Department (HOSPITAL_COMMUNITY)
Admission: EM | Admit: 2013-10-22 | Discharge: 2013-10-22 | Disposition: A | Discharge status: Home / Self Care | Payer: Self-pay | Attending: Emergency Medicine | Admitting: Emergency Medicine

## 2013-10-22 DIAGNOSIS — R509 Fever, unspecified: Secondary | ICD-10-CM | POA: Insufficient documentation

## 2013-10-22 DIAGNOSIS — Z87891 Personal history of nicotine dependence: Secondary | ICD-10-CM | POA: Insufficient documentation

## 2013-10-22 DIAGNOSIS — J45909 Unspecified asthma, uncomplicated: Secondary | ICD-10-CM | POA: Insufficient documentation

## 2013-10-22 DIAGNOSIS — R630 Anorexia: Secondary | ICD-10-CM | POA: Insufficient documentation

## 2013-10-22 DIAGNOSIS — J3489 Other specified disorders of nose and nasal sinuses: Secondary | ICD-10-CM | POA: Insufficient documentation

## 2013-10-22 DIAGNOSIS — R5381 Other malaise: Secondary | ICD-10-CM | POA: Insufficient documentation

## 2013-10-22 DIAGNOSIS — B9789 Other viral agents as the cause of diseases classified elsewhere: Secondary | ICD-10-CM | POA: Insufficient documentation

## 2013-10-22 DIAGNOSIS — J111 Influenza due to unidentified influenza virus with other respiratory manifestations: Secondary | ICD-10-CM

## 2013-10-22 DIAGNOSIS — R51 Headache: Secondary | ICD-10-CM | POA: Insufficient documentation

## 2013-10-22 DIAGNOSIS — R197 Diarrhea, unspecified: Secondary | ICD-10-CM | POA: Insufficient documentation

## 2013-10-22 DIAGNOSIS — R5383 Other fatigue: Secondary | ICD-10-CM

## 2013-10-22 DIAGNOSIS — J069 Acute upper respiratory infection, unspecified: Secondary | ICD-10-CM | POA: Insufficient documentation

## 2013-10-22 DIAGNOSIS — R69 Illness, unspecified: Secondary | ICD-10-CM

## 2013-10-22 DIAGNOSIS — R1084 Generalized abdominal pain: Secondary | ICD-10-CM | POA: Insufficient documentation

## 2013-10-22 MED ORDER — ONDANSETRON HCL 4 MG/2ML IJ SOLN
4.0000 mg | Freq: Once | INTRAMUSCULAR | Status: AC
  Administered 2013-10-22: 4 mg via INTRAVENOUS
  Filled 2013-10-22: qty 2

## 2013-10-22 MED ORDER — KETOROLAC TROMETHAMINE 30 MG/ML IJ SOLN
30.0000 mg | Freq: Once | INTRAMUSCULAR | Status: AC
Start: 2013-10-22 — End: 2013-10-22
  Administered 2013-10-22: 30 mg via INTRAVENOUS
  Filled 2013-10-22: qty 1

## 2013-10-22 MED ORDER — SODIUM CHLORIDE 0.9 % IV BOLUS (SEPSIS)
1000.0000 mL | INTRAVENOUS | Status: AC
  Administered 2013-10-22: 1000 mL via INTRAVENOUS

## 2013-10-22 MED ORDER — DICYCLOMINE HCL 10 MG PO CAPS
10.0000 mg | ORAL_CAPSULE | Freq: Once | ORAL | Status: AC
  Administered 2013-10-22: 10 mg via ORAL
  Filled 2013-10-22: qty 1

## 2013-10-22 MED ORDER — ACETAMINOPHEN 325 MG PO TABS
650.0000 mg | ORAL_TABLET | Freq: Once | ORAL | Status: AC
  Administered 2013-10-22: 650 mg via ORAL
  Filled 2013-10-22: qty 2

## 2013-10-22 NOTE — ED Provider Notes (Signed)
CSN: [id number]     Arrival date & time 10/22/13  1452 History   First MD Initiated Contact with Patient 10/22/13 1514     Chief Complaint  Patient presents with  . Nausea  . Emesis  . Diarrhea  . Headache  . URI    symptoms x 6 days   (Consider location/radiation/quality/duration/timing/severity/associated sxs/prior Treatment) Patient is a 28 y.o. male presenting with vomiting, diarrhea, headaches, and URI.  Emesis Associated symptoms: abdominal pain, chills, diarrhea, headaches, myalgias and URI   Diarrhea Associated symptoms: abdominal pain, chills, fever, headaches, myalgias, URI and vomiting   Associated symptoms: no diaphoresis   Headache Associated symptoms: abdominal pain, diarrhea, fatigue, fever, myalgias, nausea, URI and vomiting   Associated symptoms: no cough   URI Presenting symptoms: fatigue and fever   Presenting symptoms: no cough   Associated symptoms: headaches and myalgias    Pt is a 28yo male dx with influenza on 10/16/13 in the ED presenting today c/o 10 day hx of multiple symptoms including fever, chills, myalgias, generalized weakness, headache, abdominal pain, nausea, vomiting and diarrhea.  Reports last known Tmax 4 days ago in ED was 101. Has had 1 episode of NBNB emesis today. No episodes of diarrhea today. States he did take 2 tablespoons of peptobismol that did help some.  Pt has also been taking OTC acetaminophen with minimal relief in symptoms. Pt states he cannot keep anything down including applesauce he ate earlier today. Pt was discharged home with phenergan on 10/18/13 after another ED visit for same symptoms, however states he was unable to afford the medication. Reports remote hx of asthma and mild SOB last night but denies cough. Denies known sick contacts or recent travel.     Past Medical History  Diagnosis Date  . Asthma    Past Surgical History  Procedure Laterality Date  . Appendectomy     Family History  Problem Relation Age of Onset   . Diabetes Mother   . Hypertension Mother   . Diabetes Father   . Hypertension Father    History  Substance Use Topics  . Smoking status: Former Smoker    Quit date: 10/13/2013  . Smokeless tobacco: Not on file  . Alcohol Use: Yes    Review of Systems  Constitutional: Positive for fever, chills, appetite change and fatigue. Negative for diaphoresis and unexpected weight change.  Respiratory: Positive for shortness of breath. Negative for cough.   Cardiovascular: Negative for chest pain.  Gastrointestinal: Positive for nausea, vomiting, abdominal pain and diarrhea. Negative for constipation and blood in stool.  Genitourinary: Negative for dysuria and frequency.  Musculoskeletal: Positive for myalgias.  Neurological: Positive for headaches.  All other systems reviewed and are negative.    Allergies  Aspirin  Home Medications   Current Outpatient Rx  Name  Route  Sig  Dispense  Refill  . acetaminophen (TYLENOL) 500 MG tablet   Oral   Take 1,000 mg by mouth every 6 (six) hours as needed.         Marland Kitchen HYDROcodone-acetaminophen (NORCO/VICODIN) 5-325 MG per tablet   Oral   Take 2 tablets by mouth every 4 (four) hours as needed.   16 tablet   0   . promethazine (PHENERGAN) 25 MG tablet   Oral   Take 1 tablet (25 mg total) by mouth every 6 (six) hours as needed for nausea or vomiting.   12 tablet   0    BP 115/92  Pulse 116  Temp(Src) 99.2  F (37.3 C) (Oral)  Resp 20  SpO2 98% Physical Exam  Nursing note and vitals reviewed. Constitutional: He appears well-developed and well-nourished.  Pt lying in exam bed, appears mildly fatigued. NAD.  HENT:  Head: Normocephalic and atraumatic.  Right Ear: Tympanic membrane normal.  Left Ear: Tympanic membrane normal.  Nose: Mucosal edema present.  Mouth/Throat: Uvula is midline, oropharynx is clear and moist and mucous membranes are normal.  Eyes: Conjunctivae are normal. No scleral icterus.  Neck: Normal range of motion.  Neck supple.  Cardiovascular: Normal rate, regular rhythm and normal heart sounds.   Pulmonary/Chest: Effort normal and breath sounds normal. No respiratory distress. He has no wheezes. He has no rales. He exhibits no tenderness.  No respiratory distress, able to speak in full sentences w/o difficulty. Lungs: CTAB  Abdominal: Soft. Bowel sounds are normal. He exhibits no distension and no mass. There is tenderness (mild, diffuse). There is no rebound and no guarding.  Soft, non-distended, mild diffuse tenderness   Musculoskeletal: Normal range of motion.  Neurological: He is alert.  Skin: Skin is warm and dry.    ED Course  Procedures (including critical care time) Labs Review Labs Reviewed - No data to display Imaging Review No results found.  EKG Interpretation   None       MDM   1. Influenza-like illness    Pt presenting after recent dx with influenza on 10/16/13 c/o persistence of symptoms and SOB.  Reviewed previous medical records from 10/16/13 and 10/18/13 where pt was evaluated for same symptoms. CXR on 1/6. Unremarkable. Labs: CBC and BMP on 1/6: unremarkable. Pt does appear mildly fatigued but non-toxic. Vitals: mild tachycardia but otherwise WNL. Do not believe repeat labs or imaging needed at this time. Not concerned for pneumonia, Lungs: CTAB, no respiratory distress. Not concerned for acute abdomen. Abd: soft, non-distended, mild diffuse tenderness.  Will tx symptomatically and ensure pt can keep down PO fluids.   4:42 PM 1L fluids, zofran, toradol given. Pt able to keep down several ounces of water, however pt still c/o abdominal cramping.  Will give bentyl, acetaminophen, zofran and another 1L fluids.  Will then discharge pt home to f/u with PCP.  Pt still has prescription medications pending: phenergan and norco.  Advised to take medications as prescribed from visit on 10/18/13 and to take OTC acetaminophen and ibuprofen as needed for fever and pain. Encouraged fluids and rest.   Return precautions provided. Pt verbalized understanding and agreement with tx plan.      Junius FinnerErin O'Malley, PA-C 10/22/13 1714

## 2013-10-22 NOTE — ED Notes (Signed)
Pt c/o fever, N/V, chills, diarrhea and abdominal pain x 1 week. Pt c/o 10/10 pain in head and RLQ. Pt. Had appendectomy x 6-7 years ago and sts the pain starts there and radiates to the rest of his abdomen. A&Ox4. NAD noted.

## 2013-10-22 NOTE — ED Notes (Signed)
Will d/c pt. When fluids complete.

## 2013-10-22 NOTE — ED Notes (Signed)
Pt. Given water for fluid challenge  

## 2013-10-22 NOTE — ED Notes (Signed)
Pt. Able to keep down water. Pt. sts he still feels nauseous. Pt. Has not vomited at this time.

## 2013-10-22 NOTE — Discharge Instructions (Signed)
Be sure to take medications as prescribed from previous visit on 10/18/13.   Take 600 mg Ibuprofen (Motrin) every 6-8 hours for fever and pain  Alternate with Tylenol  Give 650 mg Tylenol every 4-6 hours as needed for fever and pain  Follow-up with your primary care provider next week for recheck of symptoms if not improving.  Be sure to drink plenty of fluids and rest, at least 8hrs of sleep a night, preferably more while you are sick. Return to the ED if you cannot keep down fluids/signs of dehydration, fever not reducing with Tylenol, difficulty breathing/wheezing, stiff neck, worsening condition, or other concerns (see below)

## 2013-10-22 NOTE — ED Notes (Signed)
Pt reports shortness of breath, nausea, vomiting, loose stools, fever, chills x one week. Headache unresponsive to tylenol

## 2013-10-23 NOTE — ED Provider Notes (Signed)
Medical screening examination/treatment/procedure(s) were conducted as a shared visit with non-physician practitioner(s) and myself.  I personally evaluated the patient during the encounter.  EKG Interpretation   None       Pt with nvd. abd soft nt. Po fluids.   Suzi RootsKevin E Sherilynn Dieu, MD 10/23/13 [phone]

## 2013-10-27 ENCOUNTER — Encounter (HOSPITAL_COMMUNITY): Payer: Self-pay | Admitting: Emergency Medicine

## 2013-10-27 ENCOUNTER — Emergency Department (HOSPITAL_COMMUNITY)
Admission: EM | Admit: 2013-10-27 | Discharge: 2013-10-28 | Disposition: A | Discharge status: Home / Self Care | Payer: Self-pay | Attending: Emergency Medicine | Admitting: Emergency Medicine

## 2013-10-27 DIAGNOSIS — R509 Fever, unspecified: Secondary | ICD-10-CM | POA: Insufficient documentation

## 2013-10-27 DIAGNOSIS — R1032 Left lower quadrant pain: Secondary | ICD-10-CM | POA: Insufficient documentation

## 2013-10-27 DIAGNOSIS — R197 Diarrhea, unspecified: Secondary | ICD-10-CM | POA: Insufficient documentation

## 2013-10-27 DIAGNOSIS — Z87891 Personal history of nicotine dependence: Secondary | ICD-10-CM | POA: Insufficient documentation

## 2013-10-27 DIAGNOSIS — R5383 Other fatigue: Secondary | ICD-10-CM

## 2013-10-27 DIAGNOSIS — R1110 Vomiting, unspecified: Secondary | ICD-10-CM

## 2013-10-27 DIAGNOSIS — R5381 Other malaise: Secondary | ICD-10-CM | POA: Insufficient documentation

## 2013-10-27 DIAGNOSIS — J45909 Unspecified asthma, uncomplicated: Secondary | ICD-10-CM | POA: Insufficient documentation

## 2013-10-27 DIAGNOSIS — R109 Unspecified abdominal pain: Secondary | ICD-10-CM

## 2013-10-27 DIAGNOSIS — E860 Dehydration: Secondary | ICD-10-CM

## 2013-10-27 DIAGNOSIS — R112 Nausea with vomiting, unspecified: Secondary | ICD-10-CM | POA: Insufficient documentation

## 2013-10-27 NOTE — ED Notes (Signed)
Pt reports developing generalized lower abdominal pain two weeks ago. [facility] same. Pt reports nausea, emesis, and diarrhea. Pt reports he has been unable to keep any food down including jello, ginger ale, and soup. Pt is A/O x4, in NAD, and vitals are WDL.

## 2013-10-28 ENCOUNTER — Encounter (HOSPITAL_COMMUNITY): Payer: Self-pay

## 2013-10-28 ENCOUNTER — Emergency Department (HOSPITAL_COMMUNITY): Payer: Self-pay

## 2013-10-28 LAB — URINALYSIS, ROUTINE W REFLEX MICROSCOPIC
Glucose, UA: NEGATIVE mg/dL
Hgb urine dipstick: NEGATIVE
Ketones, ur: 15 mg/dL — AB
Leukocytes, UA: NEGATIVE
Nitrite: NEGATIVE
Protein, ur: 30 mg/dL — AB
Specific Gravity, Urine: 1.025 (ref 1.005–1.030)
Urobilinogen, UA: 4 mg/dL — ABNORMAL HIGH (ref 0.0–1.0)
pH: 6 (ref 5.0–8.0)

## 2013-10-28 LAB — CBC WITH DIFFERENTIAL/PLATELET
Basophils Absolute: 0.1 10*3/uL (ref 0.0–0.1)
Basophils Relative: 2 % — ABNORMAL HIGH (ref 0–1)
Eosinophils Absolute: 0 10*3/uL (ref 0.0–0.7)
Eosinophils Relative: 0 % (ref 0–5)
HCT: 44.4 % (ref 39.0–52.0)
Hemoglobin: 15.9 g/dL (ref 13.0–17.0)
Lymphocytes Relative: 30 % (ref 12–46)
Lymphs Abs: 0.9 10*3/uL (ref 0.7–4.0)
MCH: 28 pg (ref 26.0–34.0)
MCHC: 35.8 g/dL (ref 30.0–36.0)
MCV: 78.2 fL (ref 78.0–100.0)
Monocytes Absolute: 0.2 10*3/uL (ref 0.1–1.0)
Monocytes Relative: 5 % (ref 3–12)
Neutro Abs: 2 10*3/uL (ref 1.7–7.7)
Neutrophils Relative %: 63 % (ref 43–77)
Platelets: 192 10*3/uL (ref 150–400)
RBC: 5.68 MIL/uL (ref 4.22–5.81)
RDW: 13.5 % (ref 11.5–15.5)
WBC: 3.1 10*3/uL — ABNORMAL LOW (ref 4.0–10.5)

## 2013-10-28 LAB — COMPREHENSIVE METABOLIC PANEL
ALT: 135 U/L — ABNORMAL HIGH (ref 0–53)
AST: 83 U/L — ABNORMAL HIGH (ref 0–37)
Albumin: 3.2 g/dL — ABNORMAL LOW (ref 3.5–5.2)
Alkaline Phosphatase: 50 U/L (ref 39–117)
BUN: 10 mg/dL (ref 6–23)
CO2: 24 mEq/L (ref 19–32)
Calcium: 8.5 mg/dL (ref 8.4–10.5)
Chloride: 95 mEq/L — ABNORMAL LOW (ref 96–112)
Creatinine, Ser: 0.92 mg/dL (ref 0.50–1.35)
GFR calc Af Amer: >90 mL/min (ref >90)
GFR calc non Af Amer: >90 mL/min (ref >90)
Glucose, Bld: 101 mg/dL — ABNORMAL HIGH (ref 70–99)
Potassium: 3.9 mEq/L (ref 3.7–5.3)
Sodium: 133 mEq/L — ABNORMAL LOW (ref 137–147)
Total Bilirubin: 0.3 mg/dL (ref 0.3–1.2)
Total Protein: 6.9 g/dL (ref 6.0–8.3)

## 2013-10-28 LAB — LIPASE, BLOOD: Lipase: 72 U/L — ABNORMAL HIGH (ref 11–59)

## 2013-10-28 LAB — HEPATITIS PANEL, ACUTE
HCV Ab: NEGATIVE
Hep A IgM: NONREACTIVE
Hep B C IgM: NONREACTIVE
Hepatitis B Surface Ag: NEGATIVE

## 2013-10-28 LAB — URINE MICROSCOPIC-ADD ON

## 2013-10-28 MED ORDER — MORPHINE SULFATE 4 MG/ML IJ SOLN
4.0000 mg | Freq: Once | INTRAMUSCULAR | Status: AC
  Administered 2013-10-28: 4 mg via INTRAVENOUS
  Filled 2013-10-28: qty 1

## 2013-10-28 MED ORDER — HYDROCODONE-ACETAMINOPHEN 5-325 MG PO TABS: 1.0000 | ORAL_TABLET | ORAL | Status: DC | PRN

## 2013-10-28 MED ORDER — SODIUM CHLORIDE 0.9 % IV BOLUS (SEPSIS)
1000.0000 mL | Freq: Once | INTRAVENOUS | Status: AC
  Administered 2013-10-28: 1000 mL via INTRAVENOUS

## 2013-10-28 MED ORDER — MORPHINE SULFATE 4 MG/ML IJ SOLN
4.0000 mg | Freq: Once | INTRAMUSCULAR | Status: AC
Start: 2013-10-28 — End: 2013-10-28
  Administered 2013-10-28: 4 mg via INTRAVENOUS
  Filled 2013-10-28: qty 1

## 2013-10-28 MED ORDER — ONDANSETRON HCL 4 MG/2ML IJ SOLN
4.0000 mg | Freq: Once | INTRAMUSCULAR | Status: AC
  Administered 2013-10-28: 4 mg via INTRAVENOUS
  Filled 2013-10-28: qty 2

## 2013-10-28 MED ORDER — IOHEXOL 300 MG/ML  SOLN
50.0000 mL | Freq: Once | INTRAMUSCULAR | Status: AC | PRN
  Administered 2013-10-28: 50 mL via ORAL

## 2013-10-28 MED ORDER — IOHEXOL 300 MG/ML  SOLN
100.0000 mL | Freq: Once | INTRAMUSCULAR | Status: AC | PRN
  Administered 2013-10-28: 100 mL via INTRAVENOUS

## 2013-10-28 MED ORDER — ONDANSETRON 4 MG PO TBDP: ORAL_TABLET | ORAL | Status: DC

## 2013-10-28 NOTE — ED Notes (Signed)
Pt. Reminded to keep drinking contrast so that he can go to CT.

## 2013-10-28 NOTE — ED Notes (Signed)
Pt ambulating independently w/ steady gait on d/c in no acute distress, A&Ox4. D/c instructions reviewed w/ pt and family - pt and family deny any further questions or concerns at present. Rx given x2  

## 2013-10-28 NOTE — Discharge Instructions (Signed)
:   Make appointment to followup with gastroenterologist. Return immediately for worsening pain, persistent vomiting, fever or for any concerns.  Abdominal Pain, Adult Many things can cause abdominal pain. Usually, abdominal pain is not caused by a disease and will improve without treatment. It can often be observed and treated at home. Your health care provider will do a physical exam and possibly order blood tests and X-rays to help determine the seriousness of your pain. However, in many cases, more time must pass before a clear cause of the pain can be found. Before that point, your health care provider may not know if you need more testing or further treatment. HOME CARE INSTRUCTIONS  Monitor your abdominal pain for any changes. The following actions may help to alleviate any discomfort you are experiencing:  Only take over-the-counter or prescription medicines as directed by your health care provider.  Do not take laxatives unless directed to do so by your health care provider.  Try a clear liquid diet (broth, tea, or water) as directed by your health care provider. Slowly move to a bland diet as tolerated. SEEK MEDICAL CARE IF:  You have unexplained abdominal pain.  You have abdominal pain associated with nausea or diarrhea.  You have pain when you urinate or have a bowel movement.  You experience abdominal pain that wakes you in the night.  You have abdominal pain that is worsened or improved by eating food.  You have abdominal pain that is worsened with eating fatty foods. SEEK IMMEDIATE MEDICAL CARE IF:   Your pain does not go away within 2 hours.  You have a fever.  You keep throwing up (vomiting).  Your pain is felt only in portions of the abdomen, such as the right side or the left lower portion of the abdomen.  You pass bloody or black tarry stools. MAKE SURE YOU:  Understand these instructions.   Will watch your condition.   Will get help right away if you are  not doing well or get worse.  Document Released: 07/09/2005 Document Revised: 07/20/2013 Document Reviewed: 06/08/2013 [facility] Patient Information 2014 [employer], [place].

## 2013-10-28 NOTE — ED Notes (Signed)
Pt states he is currently unable to urinate, urinal given, pt states he will ring when he is able to go.

## 2013-10-28 NOTE — ED Provider Notes (Signed)
CSN: [id number]     Arrival date & time 10/27/13  2317 History   First MD Initiated Contact with Patient 10/27/13 2350     Chief Complaint  Patient presents with  . Abdominal Pain   (Consider location/radiation/quality/duration/timing/severity/associated sxs/prior Treatment) HPI Patient presents with 2 weeks of abdominal pain with nausea and vomiting. He states he's been unable to keep solid food down. He is able to tolerate water. He's had episodic fever and chills. He reports generalized fatigue. He has had mild URI symptoms though he states that those are improved significantly. He has a nonproductive cough without any shortness of breath or chest pain. The pain is is worse with movement. He does have some pain when having bowel movements. No definite blood in his stool. Denies urinary symptoms. Past Medical History  Diagnosis Date  . Asthma    Past Surgical History  Procedure Laterality Date  . Appendectomy     Family History  Problem Relation Age of Onset  . Diabetes Mother   . Hypertension Mother   . Diabetes Father   . Hypertension Father    History  Substance Use Topics  . Smoking status: Former Smoker    Quit date: 10/13/2013  . Smokeless tobacco: Not on file  . Alcohol Use: Yes    Review of Systems  Constitutional: Positive for fever, chills and fatigue.  Respiratory: Positive for cough. Negative for shortness of breath.   Cardiovascular: Negative for chest pain, palpitations and leg swelling.  Gastrointestinal: Positive for nausea, vomiting, abdominal pain and diarrhea. Negative for blood in stool.  Genitourinary: Negative for dysuria and hematuria.  Musculoskeletal: Negative for back pain.  Skin: Negative for rash and wound.  Neurological: Negative for dizziness, weakness, light-headedness, numbness and headaches.  All other systems reviewed and are negative.    Allergies  Aspirin  Home Medications   Current Outpatient Rx  Name  Route  Sig  Dispense   Refill  . acetaminophen (TYLENOL) 500 MG tablet   Oral   Take 1,000 mg by mouth every 6 (six) hours as needed.         Marland Kitchen. HYDROcodone-acetaminophen (NORCO/VICODIN) 5-325 MG per tablet   Oral   Take 2 tablets by mouth every 4 (four) hours as needed.   16 tablet   0   . promethazine (PHENERGAN) 25 MG tablet   Oral   Take 1 tablet (25 mg total) by mouth every 6 (six) hours as needed for nausea or vomiting.   12 tablet   0    BP 123/73  Pulse 110  Temp(Src) 100.4 F (38 C) (Oral)  Resp 20  SpO2 96% Physical Exam  Nursing note and vitals reviewed. Constitutional: He is oriented to person, place, and time. He appears well-developed and well-nourished. No distress.  HENT:  Head: Normocephalic and atraumatic.  Mouth/Throat: No oropharyngeal exudate.  Dry mucous membranes.  Eyes: EOM are normal. Pupils are equal, round, and reactive to light.  Neck: Normal range of motion. Neck supple.  Cardiovascular: Normal rate and regular rhythm.   Pulmonary/Chest: Effort normal and breath sounds normal. No respiratory distress. He has no wheezes. He has no rales. He exhibits no tenderness.  Abdominal: Soft. Bowel sounds are normal. He exhibits no distension and no mass. There is tenderness (tender to palpation in the left lower quadrant.). There is no rebound and no guarding.  Musculoskeletal: Normal range of motion. He exhibits no edema and no tenderness.  Neurological: He is alert and oriented to person, place,  and time.  Moves all extremities without deficit. Sensation grossly intact.  Skin: Skin is warm and dry. No rash noted. No erythema.  Psychiatric: He has a normal mood and affect. His behavior is normal.    ED Course  Procedures (including critical care time) Labs Review Labs Reviewed  CBC WITH DIFFERENTIAL  COMPREHENSIVE METABOLIC PANEL  LIPASE, BLOOD  URINALYSIS, ROUTINE W REFLEX MICROSCOPIC   Imaging Review No results found.  EKG Interpretation   None       MDM   Concern for diverticulitis/sigmoid colitis or proctitis. Patient also appears dehydrated.  Patient's pain is improved though still requiring episodic doses of pain medication. He's had no nausea vomiting in the emergency department. He is tolerating fluids. Vital signs have improved. Patient has mildly deranged liver function tests. This possibly may represent acute hepatitis. If encouraged to followup with gastroenterology and will send off a hepatitis panel. He's been advised to return immediately to the emergency department for worsening abdominal pain, inability to tolerate fluids or for any concerns. Patient's voice understanding and agrees with plan.  Loren Racer, MD 10/28/13 [phone]

## 2013-10-30 ENCOUNTER — Emergency Department (HOSPITAL_COMMUNITY)
Admission: EM | Admit: 2013-10-30 | Discharge: 2013-10-30 | Disposition: A | Discharge status: Home / Self Care | Payer: Self-pay | Attending: Emergency Medicine | Admitting: Emergency Medicine

## 2013-10-30 ENCOUNTER — Encounter (HOSPITAL_COMMUNITY): Payer: Self-pay | Admitting: Emergency Medicine

## 2013-10-30 DIAGNOSIS — Z87891 Personal history of nicotine dependence: Secondary | ICD-10-CM | POA: Insufficient documentation

## 2013-10-30 DIAGNOSIS — R109 Unspecified abdominal pain: Secondary | ICD-10-CM

## 2013-10-30 DIAGNOSIS — R110 Nausea: Secondary | ICD-10-CM | POA: Insufficient documentation

## 2013-10-30 DIAGNOSIS — Z9889 Other specified postprocedural states: Secondary | ICD-10-CM | POA: Insufficient documentation

## 2013-10-30 DIAGNOSIS — K920 Hematemesis: Secondary | ICD-10-CM | POA: Insufficient documentation

## 2013-10-30 DIAGNOSIS — J45909 Unspecified asthma, uncomplicated: Secondary | ICD-10-CM | POA: Insufficient documentation

## 2013-10-30 DIAGNOSIS — R1032 Left lower quadrant pain: Secondary | ICD-10-CM | POA: Insufficient documentation

## 2013-10-30 MED ORDER — METOCLOPRAMIDE HCL 5 MG/ML IJ SOLN
10.0000 mg | Freq: Once | INTRAMUSCULAR | Status: AC
  Administered 2013-10-30: 10 mg via INTRAVENOUS
  Filled 2013-10-30: qty 2

## 2013-10-30 MED ORDER — SODIUM CHLORIDE 0.9 % IV BOLUS (SEPSIS)
1000.0000 mL | Freq: Once | INTRAVENOUS | Status: AC
  Administered 2013-10-30: 1000 mL via INTRAVENOUS

## 2013-10-30 MED ORDER — METOCLOPRAMIDE HCL 10 MG PO TABS: 10.0000 mg | ORAL_TABLET | Freq: Four times a day (QID) | ORAL | Status: DC

## 2013-10-30 NOTE — Discharge Instructions (Signed)
Abdominal Pain, Adult °Many things can cause abdominal pain. Usually, abdominal pain is not caused by a disease and will improve without treatment. It can often be observed and treated at home. Your health care provider will do a physical exam and possibly order blood tests and X-rays to help determine the seriousness of your pain. However, in many cases, more time must pass before a clear cause of the pain can be found. Before that point, your health care provider may not know if you need more testing or further treatment. °HOME CARE INSTRUCTIONS  °Monitor your abdominal pain for any changes. The following actions may help to alleviate any discomfort you are experiencing: °· Only take over-the-counter or prescription medicines as directed by your health care provider. °· Do not take laxatives unless directed to do so by your health care provider. °· Try a clear liquid diet (broth, tea, or water) as directed by your health care provider. Slowly move to a bland diet as tolerated. °SEEK MEDICAL CARE IF: °· You have unexplained abdominal pain. °· You have abdominal pain associated with nausea or diarrhea. °· You have pain when you urinate or have a bowel movement. °· You experience abdominal pain that wakes you in the night. °· You have abdominal pain that is worsened or improved by eating food. °· You have abdominal pain that is worsened with eating fatty foods. °SEEK IMMEDIATE MEDICAL CARE IF:  °· Your pain does not go away within 2 hours. °· You have a fever. °· You keep throwing up (vomiting). °· Your pain is felt only in portions of the abdomen, such as the right side or the left lower portion of the abdomen. °· You pass bloody or black tarry stools. °MAKE SURE YOU: °· Understand these instructions.   °· Will watch your condition.   °· Will get help right away if you are not doing well or get worse.   °Document Released: 07/09/2005 Document Revised: 07/20/2013 Document Reviewed: 06/08/2013 °[employer]® Patient  Information ©2014 [employer]. ° °

## 2013-10-30 NOTE — ED Notes (Signed)
Pt states that he has a nasty taste in his mouth and sores inside his mouth. Pt states when he burps it taste and smell bad. Pt mother is concerned because pt has lost about 15 lbs in the last 3 weeks. Eating makes the pt stomach blow up and then he vomits. Pt has been drinking boost over the last few days which he has tolerated.

## 2013-10-30 NOTE — ED Notes (Signed)
Pt seen in ED on 1/4, 1/6, 1/10, 1/15 for abdominal pain. Reports being told he needed to follow up with gastroenterologist. Mother reports pt does not have insurance so it is not possible for him to go see gastroenterologist. Pt reports last night he vomited blood. Denies vomiting today. Abdominal pain 10/10

## 2013-10-30 NOTE — ED Provider Notes (Signed)
CSN: [id number]     Arrival date & time 10/30/13  1405 History   First MD Initiated Contact with Patient 10/30/13 1605     Chief Complaint  Patient presents with  . Abdominal Pain  . vomiting blood    (Consider location/radiation/quality/duration/timing/severity/associated sxs/prior Treatment) Patient is a 28 y.o. male presenting with abdominal pain. The history is provided by the patient. No language interpreter was used.  Abdominal Pain Pain location:  LLQ Pain quality: aching and cramping   Pain severity:  Moderate Onset quality:  Gradual Duration:  3 weeks Timing:  Constant Progression:  Waxing and waning Associated symptoms: nausea and vomiting   Associated symptoms: no chills, no dysuria and no fever   Associated symptoms comment:  He returns to the ED for further evaluation of persistent LLQ abdominal pain that started 3 weeks ago. He has not been able to follow up outpatient secondary to financial and insurance constraints. No fever. He has had nausea and vomiting. He reports copious bloody emesis x 1 yesterday but none since. He denies melena.    Past Medical History  Diagnosis Date  . Asthma    Past Surgical History  Procedure Laterality Date  . Appendectomy     Family History  Problem Relation Age of Onset  . Diabetes Mother   . Hypertension Mother   . Diabetes Father   . Hypertension Father    History  Substance Use Topics  . Smoking status: Former Smoker    Quit date: 10/13/2013  . Smokeless tobacco: Not on file  . Alcohol Use: Yes    Review of Systems  Constitutional: Negative for fever and chills.  Respiratory: Negative.   Cardiovascular: Negative.   Gastrointestinal: Positive for nausea, vomiting and abdominal pain.  Genitourinary: Negative.  Negative for dysuria.  Musculoskeletal: Negative.   Skin: Negative.   Neurological: Negative.     Allergies  Aspirin  Home Medications   Current Outpatient Rx  Name  Route  Sig  Dispense  Refill  .  acetaminophen (TYLENOL) 500 MG tablet   Oral   Take 1,000 mg by mouth every 6 (six) hours as needed.         Marland Kitchen HYDROcodone-acetaminophen (NORCO) 5-325 MG per tablet   Oral   Take 1 tablet by mouth every 4 (four) hours as needed.   10 tablet   0   . HYDROcodone-acetaminophen (NORCO/VICODIN) 5-325 MG per tablet   Oral   Take 2 tablets by mouth every 4 (four) hours as needed.   16 tablet   0   . ondansetron (ZOFRAN ODT) 4 MG disintegrating tablet      4mg  ODT q4 hours prn nausea/vomit   8 tablet   0   . promethazine (PHENERGAN) 25 MG tablet   Oral   Take 1 tablet (25 mg total) by mouth every 6 (six) hours as needed for nausea or vomiting.   12 tablet   0    BP 124/69  Pulse 100  Temp(Src) 99 F (37.2 C)  Resp 16  SpO2 97% Physical Exam  Constitutional: He is oriented to person, place, and time. He appears well-developed and well-nourished.  HENT:  Head: Normocephalic.  Neck: Normal range of motion. Neck supple.  Cardiovascular: Normal rate and regular rhythm.   Pulmonary/Chest: Effort normal and breath sounds normal.  Abdominal: Soft. Bowel sounds are normal. There is tenderness. There is guarding. There is no rebound.  LLQ tenderness to palpation.  Musculoskeletal: Normal range of motion.  Neurological: He  is alert and oriented to person, place, and time.  Skin: Skin is warm and dry. No rash noted.  Psychiatric: He has a normal mood and affect.    ED Course  Procedures (including critical care time) Labs Review Labs Reviewed - No data to display Imaging Review No results found.  EKG Interpretation   None       MDM  No diagnosis found. 1. Persistent abdominal pain  Hepatitis panel performed on 10/27/13 resulted and are negative on chart review today. CT abd/pel w/CM 10/28/13 - negative for abnormal finding  On re-evaluation, he is sleeping. No further vomiting. He reports nausea resolved. Discussed importance of outpatient follow up for definitive  diagnosis. Patient and family understanding.  Arnoldo HookerShari A Aayushi Solorzano, PA-C 10/30/13 1819

## 2013-11-02 NOTE — ED Provider Notes (Signed)
Medical screening examination/treatment/procedure(s) were performed by non-physician practitioner and as supervising physician I was immediately available for consultation/collaboration.   Keyari Kleeman L Heru Montz, MD 11/02/13 2136 

## 2013-12-01 ENCOUNTER — Ambulatory Visit (INDEPENDENT_AMBULATORY_CARE_PROVIDER_SITE_OTHER): Payer: Self-pay

## 2013-12-01 DIAGNOSIS — Z113 Encounter for screening for infections with a predominantly sexual mode of transmission: Secondary | ICD-10-CM

## 2013-12-01 DIAGNOSIS — Z79899 Other long term (current) drug therapy: Secondary | ICD-10-CM

## 2013-12-01 DIAGNOSIS — Z23 Encounter for immunization: Secondary | ICD-10-CM

## 2013-12-01 DIAGNOSIS — B20 Human immunodeficiency virus [HIV] disease: Secondary | ICD-10-CM

## 2013-12-01 LAB — LIPID PANEL
Cholesterol: 150 mg/dL (ref 0–200)
HDL: 44 mg/dL (ref >39)
LDL Cholesterol: 96 mg/dL (ref 0–99)
Total CHOL/HDL Ratio: 3.4 Ratio
Triglycerides: 48 mg/dL (ref <150)
VLDL: 10 mg/dL (ref 0–40)

## 2013-12-01 LAB — COMPLETE METABOLIC PANEL WITH GFR
ALT: 69 U/L — ABNORMAL HIGH (ref 0–53)
AST: 47 U/L — ABNORMAL HIGH (ref 0–37)
Albumin: 4.1 g/dL (ref 3.5–5.2)
Alkaline Phosphatase: 67 U/L (ref 39–117)
BUN: 6 mg/dL (ref 6–23)
CO2: 29 mEq/L (ref 19–32)
Calcium: 9.2 mg/dL (ref 8.4–10.5)
Chloride: 102 mEq/L (ref 96–112)
Creat: 0.79 mg/dL (ref 0.50–1.35)
GFR, Est African American: >89 mL/min
GFR, Est Non African American: >89 mL/min
Glucose, Bld: 87 mg/dL (ref 70–99)
Potassium: 4 mEq/L (ref 3.5–5.3)
Sodium: 136 mEq/L (ref 135–145)
Total Bilirubin: 0.5 mg/dL (ref 0.2–1.2)
Total Protein: 8.4 g/dL — ABNORMAL HIGH (ref 6.0–8.3)

## 2013-12-02 LAB — URINALYSIS
Glucose, UA: NEGATIVE mg/dL
Hgb urine dipstick: NEGATIVE
Ketones, ur: NEGATIVE mg/dL
Nitrite: NEGATIVE
Protein, ur: NEGATIVE mg/dL
Specific Gravity, Urine: 1.023 (ref 1.005–1.030)
Urobilinogen, UA: 1 mg/dL (ref 0.0–1.0)
pH: 6 (ref 5.0–8.0)

## 2013-12-02 LAB — CBC WITH DIFFERENTIAL/PLATELET
Basophils Absolute: 0.2 10*3/uL — ABNORMAL HIGH (ref 0.0–0.1)
Basophils Relative: 2 % — ABNORMAL HIGH (ref 0–1)
Eosinophils Absolute: 0.2 10*3/uL (ref 0.0–0.7)
Eosinophils Relative: 2 % (ref 0–5)
HCT: 41.8 % (ref 39.0–52.0)
Hemoglobin: 14.4 g/dL (ref 13.0–17.0)
Lymphocytes Relative: 49 % — ABNORMAL HIGH (ref 12–46)
Lymphs Abs: 4 10*3/uL (ref 0.7–4.0)
MCH: 26.8 pg (ref 26.0–34.0)
MCHC: 34.4 g/dL (ref 30.0–36.0)
MCV: 77.7 fL — ABNORMAL LOW (ref 78.0–100.0)
Monocytes Absolute: 0.7 10*3/uL (ref 0.1–1.0)
Monocytes Relative: 8 % (ref 3–12)
Neutro Abs: 3.2 10*3/uL (ref 1.7–7.7)
Neutrophils Relative %: 39 % — ABNORMAL LOW (ref 43–77)
Platelets: 263 10*3/uL (ref 150–400)
RBC: 5.38 MIL/uL (ref 4.22–5.81)
RDW: 14 % (ref 11.5–15.5)
WBC: 8.2 10*3/uL (ref 4.0–10.5)

## 2013-12-02 LAB — T-HELPER CELL (CD4) - (RCID CLINIC ONLY)
CD4 % Helper T Cell: 10 % — ABNORMAL LOW (ref 33–55)
CD4 T Cell Abs: 400 /uL (ref 400–2700)

## 2013-12-02 LAB — HEPATITIS B SURFACE ANTIBODY,QUALITATIVE: Hep B S Ab: POSITIVE — AB

## 2013-12-02 LAB — HEPATITIS C ANTIBODY: HCV Ab: NEGATIVE

## 2013-12-02 LAB — PATHOLOGIST SMEAR REVIEW

## 2013-12-02 LAB — RPR

## 2013-12-02 LAB — HEPATITIS B SURFACE ANTIGEN: Hepatitis B Surface Ag: NEGATIVE

## 2013-12-02 LAB — HEPATITIS B CORE ANTIBODY, TOTAL: Hep B Core Total Ab: NONREACTIVE

## 2013-12-02 LAB — HEPATITIS A ANTIBODY, TOTAL: Hep A Total Ab: NONREACTIVE

## 2013-12-02 LAB — URINE CYTOLOGY ANCILLARY ONLY
Chlamydia: NEGATIVE
Neisseria Gonorrhea: NEGATIVE

## 2013-12-05 LAB — HIV-1 RNA ULTRAQUANT REFLEX TO GENTYP+
HIV 1 RNA Quant: 556088 copies/mL — ABNORMAL HIGH (ref <20)
HIV-1 RNA Quant, Log: 5.75 {Log} — ABNORMAL HIGH (ref <1.30)

## 2013-12-05 NOTE — Progress Notes (Signed)
Patient diagnosed with HIV thorough plasma donation.  His last negative test was October 2014. He is bisexual and mostly has oral sex with males met via Internet.  When he is engaged in MSM relations his preference is insertive. In January 2015 he experienced flu like symptoms about 6 weeks after unprotected intercourse with a male.  He is surprised by new diagnosis but is coping at this point. Counseling services offered and he refused at this time.   Patient has holes in several teeth and will need a dental referral.  He has 5 tattoos 3 of which were done in the home and 2 in a shop.  Bilateral ear piercings done at home.  Vaccines updated.  No medical records to request.   Laverle Patter, RN

## 2013-12-13 LAB — HIV-1 GENOTYPR PLUS

## 2013-12-22 ENCOUNTER — Ambulatory Visit: Payer: Self-pay | Admitting: Infectious Disease

## 2013-12-29 ENCOUNTER — Ambulatory Visit (INDEPENDENT_AMBULATORY_CARE_PROVIDER_SITE_OTHER): Payer: Self-pay | Admitting: Infectious Disease

## 2013-12-29 ENCOUNTER — Encounter: Payer: Self-pay | Admitting: Infectious Disease

## 2013-12-29 ENCOUNTER — Encounter: Payer: Self-pay | Admitting: *Deleted

## 2013-12-29 VITALS — Ht 69.0 in | Wt 165.0 lb

## 2013-12-29 DIAGNOSIS — L819 Disorder of pigmentation, unspecified: Secondary | ICD-10-CM

## 2013-12-29 DIAGNOSIS — Z23 Encounter for immunization: Secondary | ICD-10-CM

## 2013-12-29 DIAGNOSIS — B20 Human immunodeficiency virus [HIV] disease: Secondary | ICD-10-CM | POA: Insufficient documentation

## 2013-12-29 DIAGNOSIS — L818 Other specified disorders of pigmentation: Secondary | ICD-10-CM

## 2013-12-29 DIAGNOSIS — J45909 Unspecified asthma, uncomplicated: Secondary | ICD-10-CM

## 2013-12-29 MED ORDER — EFAVIRENZ-EMTRICITAB-TENOFOVIR 600-200-300 MG PO TABS: 1.0000 | ORAL_TABLET | Freq: Every day | ORAL | Status: DC

## 2013-12-29 NOTE — Progress Notes (Signed)
   Subjective:    Patient ID: Travis Herring, male    DOB: 18-Aug-1986, 28 y.o.   MRN: [id number]  HPI  28 year old African American with HIV newly diagnosed with high viral load of over 500K copies and CD4 of 400. His virus appears to be wildtype though we have not assayed for INI resistance.  He needs to enroll in ADAP but we DO have some Atripla samples that we will send him out on today.   Review of Systems  Constitutional: Negative for fever, chills, diaphoresis, activity change, appetite change, fatigue and unexpected weight change.  HENT: Negative for congestion, rhinorrhea, sinus pressure, sneezing, sore throat and trouble swallowing.   Eyes: Negative for photophobia and visual disturbance.  Respiratory: Negative for cough, chest tightness, shortness of breath, wheezing and stridor.   Cardiovascular: Negative for chest pain, palpitations and leg swelling.  Gastrointestinal: Negative for nausea, vomiting, abdominal pain, diarrhea, constipation, blood in stool, abdominal distention and anal bleeding.  Genitourinary: Negative for dysuria, hematuria, flank pain and difficulty urinating.  Musculoskeletal: Negative for arthralgias, back pain, gait problem, joint swelling and myalgias.  Skin: Negative for color change, pallor, rash and wound.  Neurological: Negative for dizziness, tremors, weakness and light-headedness.  Hematological: Negative for adenopathy. Does not bruise/bleed easily.  Psychiatric/Behavioral: Negative for behavioral problems, confusion, sleep disturbance, dysphoric mood, decreased concentration and agitation.       Objective:   Physical Exam  Constitutional: He is oriented to person, place, and time. He appears well-developed and well-nourished. No distress.  HENT:  Head: Normocephalic and atraumatic.  Mouth/Throat: Oropharynx is clear and moist. No oropharyngeal exudate.  Eyes: Conjunctivae and EOM are normal. Pupils are equal, round, and reactive to light. No  scleral icterus.  Neck: Normal range of motion. Neck supple. No JVD present.  Cardiovascular: Normal rate, regular rhythm and normal heart sounds.  Exam reveals no gallop and no friction rub.   No murmur heard. Pulmonary/Chest: Effort normal and breath sounds normal. No respiratory distress. He has no wheezes. He has no rales. He exhibits no tenderness.  Abdominal: He exhibits no distension and no mass. There is no tenderness. There is no rebound and no guarding.  Musculoskeletal: He exhibits no edema and no tenderness.  Lymphadenopathy:    He has no cervical adenopathy.  Neurological: He is alert and oriented to person, place, and time. He exhibits normal muscle tone. Coordination normal.  Skin: Skin is warm and dry. He is not diaphoretic. No erythema. No pallor.  Psychiatric: He has a normal mood and affect. His behavior is normal. Judgment and thought content normal.          Assessment & Plan:   HIV: will start ATripla qhs. Likely switch to STRIBILD or TRIUMEQ once ADAP approved. RTC in 1 month for labs  Vaccinate for hep a and flu  Condoms  I spent greater than 60 minutes with the patient including greater than 50% of time in face to face counsel of the patient and in coordination of their care.  Asthma: revisit ? Need for steroid inhaler

## 2014-01-02 ENCOUNTER — Other Ambulatory Visit: Payer: Self-pay | Admitting: *Deleted

## 2014-01-02 MED ORDER — EFAVIRENZ-EMTRICITAB-TENOFOVIR 600-200-300 MG PO TABS: 1.0000 | ORAL_TABLET | Freq: Every day | ORAL | Status: DC

## 2014-01-26 ENCOUNTER — Other Ambulatory Visit: Payer: Self-pay | Admitting: *Deleted

## 2014-01-26 DIAGNOSIS — B20 Human immunodeficiency virus [HIV] disease: Secondary | ICD-10-CM

## 2014-01-26 MED ORDER — EFAVIRENZ-EMTRICITAB-TENOFOVIR 600-200-300 MG PO TABS: 1.0000 | ORAL_TABLET | Freq: Every day | ORAL | Status: DC

## 2014-01-30 ENCOUNTER — Other Ambulatory Visit (INDEPENDENT_AMBULATORY_CARE_PROVIDER_SITE_OTHER): Payer: Self-pay

## 2014-01-30 DIAGNOSIS — B20 Human immunodeficiency virus [HIV] disease: Secondary | ICD-10-CM

## 2014-01-30 LAB — CBC WITH DIFFERENTIAL/PLATELET
Basophils Absolute: 0 10*3/uL (ref 0.0–0.1)
Basophils Relative: 0 % (ref 0–1)
Eosinophils Absolute: 0.2 10*3/uL (ref 0.0–0.7)
Eosinophils Relative: 4 % (ref 0–5)
HCT: 39.6 % (ref 39.0–52.0)
Hemoglobin: 13.7 g/dL (ref 13.0–17.0)
Lymphocytes Relative: 43 % (ref 12–46)
Lymphs Abs: 2.1 10*3/uL (ref 0.7–4.0)
MCH: 26.7 pg (ref 26.0–34.0)
MCHC: 34.6 g/dL (ref 30.0–36.0)
MCV: 77 fL — ABNORMAL LOW (ref 78.0–100.0)
Monocytes Absolute: 0.3 10*3/uL (ref 0.1–1.0)
Monocytes Relative: 6 % (ref 3–12)
Neutro Abs: 2.3 10*3/uL (ref 1.7–7.7)
Neutrophils Relative %: 47 % (ref 43–77)
Platelets: 225 10*3/uL (ref 150–400)
RBC: 5.14 MIL/uL (ref 4.22–5.81)
RDW: 15.9 % — ABNORMAL HIGH (ref 11.5–15.5)
WBC: 4.8 10*3/uL (ref 4.0–10.5)

## 2014-01-31 LAB — COMPLETE METABOLIC PANEL WITH GFR
ALT: 29 U/L (ref 0–53)
AST: 22 U/L (ref 0–37)
Albumin: 4.2 g/dL (ref 3.5–5.2)
Alkaline Phosphatase: 72 U/L (ref 39–117)
BUN: 10 mg/dL (ref 6–23)
CO2: 30 mEq/L (ref 19–32)
Calcium: 9.4 mg/dL (ref 8.4–10.5)
Chloride: 105 mEq/L (ref 96–112)
Creat: 0.78 mg/dL (ref 0.50–1.35)
GFR, Est African American: >89 mL/min
GFR, Est Non African American: >89 mL/min
Glucose, Bld: 96 mg/dL (ref 70–99)
Potassium: 4.3 mEq/L (ref 3.5–5.3)
Sodium: 141 mEq/L (ref 135–145)
Total Bilirubin: 0.5 mg/dL (ref 0.2–1.2)
Total Protein: 7.6 g/dL (ref 6.0–8.3)

## 2014-01-31 LAB — HIV-1 RNA QUANT-NO REFLEX-BLD
HIV 1 RNA Quant: 523 copies/mL — ABNORMAL HIGH (ref <20)
HIV-1 RNA Quant, Log: 2.72 {Log} — ABNORMAL HIGH (ref <1.30)

## 2014-01-31 LAB — T-HELPER CELL (CD4) - (RCID CLINIC ONLY)
CD4 % Helper T Cell: 29 % — ABNORMAL LOW (ref 33–55)
CD4 T Cell Abs: 700 /uL (ref 400–2700)

## 2014-02-16 ENCOUNTER — Ambulatory Visit (INDEPENDENT_AMBULATORY_CARE_PROVIDER_SITE_OTHER): Payer: Self-pay | Admitting: Infectious Disease

## 2014-02-16 ENCOUNTER — Encounter: Payer: Self-pay | Admitting: Infectious Disease

## 2014-02-16 VITALS — BP 150/96 | HR 102 | Temp 98.5°F | Wt 164.0 lb

## 2014-02-16 DIAGNOSIS — J45909 Unspecified asthma, uncomplicated: Secondary | ICD-10-CM

## 2014-02-16 DIAGNOSIS — B20 Human immunodeficiency virus [HIV] disease: Secondary | ICD-10-CM

## 2014-02-16 MED ORDER — EMTRICITAB-RILPIVIR-TENOFOV DF 200-25-300 MG PO TABS: 1.0000 | ORAL_TABLET | Freq: Every day | ORAL | Status: DC

## 2014-02-16 NOTE — Progress Notes (Signed)
Subjective:    Patient ID: Travis Herring, male    DOB: [birth year], 28 y.o.   MRN: [id number]  HPI   28 year old African American with HIV newly diagnosed with high viral load of over 500K copies and CD4 of 400. His virus appears to be wildtype though we have not assayed for INI resistance.  At his first visit I gave him some Atripla samples and we enrolled in the AIDS drug assistance program. Since then his viral load has dropped from 500,500 copies and CD4 count has gone to above 700.  He has missed approximately 4 doses of his Atripla in the last several months 2 doses were missed do to drugs not being picked up on time per pharmacy to dose her meds because he forgot to take it at night.  He would prefer to take meds during the day and reviewed various antiretroviral regimens and ultimately decided to go with Complera once daily with a 400 k calorie meal.  He's also noticed since starting antiretroviral medicines that his semen seems to be squirting out more dramatically when he masturbates and he wonders if this is a side effect of the medication. I am not aware of that being a specific side effect of his medications though I have had other HIV positive patients patients noticed a change in her sperm, composition after starting antiretroviral meds   Review of Systems  Constitutional: Negative for fever, chills, diaphoresis, activity change, appetite change, fatigue and unexpected weight change.  HENT: Negative for congestion, rhinorrhea, sinus pressure, sneezing, sore throat and trouble swallowing.   Eyes: Negative for photophobia and visual disturbance.  Respiratory: Negative for cough, chest tightness, shortness of breath, wheezing and stridor.   Cardiovascular: Negative for chest pain, palpitations and leg swelling.  Gastrointestinal: Negative for nausea, vomiting, abdominal pain, diarrhea, constipation, blood in stool, abdominal distention and anal bleeding.  Genitourinary: Negative  for dysuria, hematuria, flank pain and difficulty urinating.  Musculoskeletal: Negative for arthralgias, back pain, gait problem, joint swelling and myalgias.  Skin: Negative for color change, pallor, rash and wound.  Neurological: Negative for dizziness, tremors, weakness and light-headedness.  Hematological: Negative for adenopathy. Does not bruise/bleed easily.  Psychiatric/Behavioral: Negative for behavioral problems, confusion, sleep disturbance, dysphoric mood, decreased concentration and agitation.       Objective:   Physical Exam  Constitutional: He is oriented to person, place, and time. He appears well-developed and well-nourished. No distress.  HENT:  Head: Normocephalic and atraumatic.  Mouth/Throat: Oropharynx is clear and moist. No oropharyngeal exudate.  Eyes: Conjunctivae and EOM are normal. Pupils are equal, round, and reactive to light. No scleral icterus.  Neck: Normal range of motion. Neck supple. No JVD present.  Cardiovascular: Normal rate, regular rhythm and normal heart sounds.  Exam reveals no gallop and no friction rub.   No murmur heard. Pulmonary/Chest: Effort normal and breath sounds normal. No respiratory distress. He has no wheezes. He has no rales. He exhibits no tenderness.  Abdominal: He exhibits no distension and no mass. There is no tenderness. There is no rebound and no guarding.  Musculoskeletal: He exhibits no edema and no tenderness.  Lymphadenopathy:    He has no cervical adenopathy.  Neurological: He is alert and oriented to person, place, and time. He exhibits normal muscle tone. Coordination normal.  Skin: Skin is warm and dry. He is not diaphoretic. No erythema. No pallor.  Psychiatric: He has a normal mood and affect. His behavior is normal. Judgment and  thought content normal.          Assessment & Plan:   HIV: Change to Complera, rtc in one month for check of viral load and CD4 count  Vaccinate for hep a and flu  Condoms  I  spent greater than 40 minutes with the patient including greater than 50% of time in face to face counsel of the patient and in coordination of their care.  Asthma: revisit ? Need for steroid inhaler  Former smoker: quit in January

## 2014-03-20 ENCOUNTER — Other Ambulatory Visit: Payer: Self-pay

## 2014-03-23 ENCOUNTER — Other Ambulatory Visit: Payer: Self-pay

## 2014-04-05 ENCOUNTER — Ambulatory Visit: Payer: Self-pay | Admitting: Infectious Disease

## 2014-04-11 ENCOUNTER — Ambulatory Visit: Payer: Self-pay | Admitting: Infectious Disease

## 2014-05-10 ENCOUNTER — Ambulatory Visit (INDEPENDENT_AMBULATORY_CARE_PROVIDER_SITE_OTHER): Payer: Self-pay | Admitting: Infectious Disease

## 2014-05-10 ENCOUNTER — Encounter: Payer: Self-pay | Admitting: Infectious Disease

## 2014-05-10 VITALS — BP 128/88 | HR 76 | Temp 98.8°F | Wt 168.8 lb

## 2014-05-10 DIAGNOSIS — J4541 Moderate persistent asthma with (acute) exacerbation: Secondary | ICD-10-CM

## 2014-05-10 DIAGNOSIS — B20 Human immunodeficiency virus [HIV] disease: Secondary | ICD-10-CM

## 2014-05-10 DIAGNOSIS — Z113 Encounter for screening for infections with a predominantly sexual mode of transmission: Secondary | ICD-10-CM

## 2014-05-10 DIAGNOSIS — J45901 Unspecified asthma with (acute) exacerbation: Secondary | ICD-10-CM

## 2014-05-10 LAB — COMPLETE METABOLIC PANEL WITH GFR
ALT: 14 U/L (ref 0–53)
AST: 16 U/L (ref 0–37)
Albumin: 4.3 g/dL (ref 3.5–5.2)
Alkaline Phosphatase: 65 U/L (ref 39–117)
BUN: 10 mg/dL (ref 6–23)
CO2: 29 mEq/L (ref 19–32)
Calcium: 9.9 mg/dL (ref 8.4–10.5)
Chloride: 103 mEq/L (ref 96–112)
Creat: 0.94 mg/dL (ref 0.50–1.35)
GFR, Est African American: >89 mL/min
GFR, Est Non African American: >89 mL/min
Glucose, Bld: 92 mg/dL (ref 70–99)
Potassium: 4.6 mEq/L (ref 3.5–5.3)
Sodium: 140 mEq/L (ref 135–145)
Total Bilirubin: 0.8 mg/dL (ref 0.2–1.2)
Total Protein: 7.5 g/dL (ref 6.0–8.3)

## 2014-05-10 LAB — CBC WITH DIFFERENTIAL/PLATELET
Basophils Absolute: 0 10*3/uL (ref 0.0–0.1)
Basophils Relative: 0 % (ref 0–1)
Eosinophils Absolute: 0.1 10*3/uL (ref 0.0–0.7)
Eosinophils Relative: 2 % (ref 0–5)
HCT: 43.7 % (ref 39.0–52.0)
Hemoglobin: 14.9 g/dL (ref 13.0–17.0)
Lymphocytes Relative: 31 % (ref 12–46)
Lymphs Abs: 1.7 10*3/uL (ref 0.7–4.0)
MCH: 27.6 pg (ref 26.0–34.0)
MCHC: 34.1 g/dL (ref 30.0–36.0)
MCV: 80.9 fL (ref 78.0–100.0)
Monocytes Absolute: 0.4 10*3/uL (ref 0.1–1.0)
Monocytes Relative: 7 % (ref 3–12)
Neutro Abs: 3.4 10*3/uL (ref 1.7–7.7)
Neutrophils Relative %: 60 % (ref 43–77)
Platelets: 235 10*3/uL (ref 150–400)
RBC: 5.4 MIL/uL (ref 4.22–5.81)
RDW: 14.9 % (ref 11.5–15.5)
WBC: 5.6 10*3/uL (ref 4.0–10.5)

## 2014-05-10 NOTE — Progress Notes (Signed)
Subjective:    Patient ID: Travis Herring, male    DOB: Sep 20, 1986, 28 y.o.   MRN: [id number]  HPI   28 year old African American with HIV newly diagnosed with high viral load of over 500K copies and CD4 of 400. His virus appears to be wildtype though we have not assayed for INI resistance.   At his first visit I gave him some Atripla samples and we enrolled in the AIDS drug assistance program. Since then his viral load has dropped from 500,500 copies and CD4 count has gone to above 700.  We changed him to comply with a 400 k calorie meal.   Lab Results  Component Value Date   HIV1RNAQUANT 523* 01/30/2014   Lab Results  Component Value Date   CD4TABS 700 01/30/2014   CD4TABS 400 12/01/2013    unfortunately he did not followup after that change of medicine and we have not been able to reassay his viral load. He states that in the last week he missed partially treated for dose because he ran out of medicines. I'm hoping that his viral load is still below 200 otherwise will have to bring him back for repeat check on his viral load.   Review of Systems  Constitutional: Negative for fever, chills, diaphoresis, activity change, appetite change, fatigue and unexpected weight change.  HENT: Negative for congestion, rhinorrhea, sinus pressure, sneezing, sore throat and trouble swallowing.   Eyes: Negative for photophobia and visual disturbance.  Respiratory: Negative for cough, chest tightness, shortness of breath, wheezing and stridor.   Cardiovascular: Negative for chest pain, palpitations and leg swelling.  Gastrointestinal: Negative for nausea, vomiting, abdominal pain, diarrhea, constipation, blood in stool, abdominal distention and anal bleeding.  Genitourinary: Negative for dysuria, hematuria, flank pain and difficulty urinating.  Musculoskeletal: Negative for arthralgias, back pain, gait problem, joint swelling and myalgias.  Skin: Negative for color change, pallor, rash and wound.    Neurological: Negative for dizziness, tremors, weakness and light-headedness.  Hematological: Negative for adenopathy. Does not bruise/bleed easily.  Psychiatric/Behavioral: Negative for behavioral problems, confusion, sleep disturbance, dysphoric mood, decreased concentration and agitation.       Objective:   Physical Exam  Constitutional: He is oriented to person, place, and time. He appears well-developed and well-nourished. No distress.  HENT:  Head: Normocephalic and atraumatic.  Mouth/Throat: Oropharynx is clear and moist. No oropharyngeal exudate.  Eyes: Conjunctivae and EOM are normal. Pupils are equal, round, and reactive to light. No scleral icterus.  Neck: Normal range of motion. Neck supple. No JVD present.  Cardiovascular: Normal rate, regular rhythm and normal heart sounds.  Exam reveals no gallop and no friction rub.   No murmur heard. Pulmonary/Chest: Effort normal and breath sounds normal. No respiratory distress. He has no wheezes. He has no rales. He exhibits no tenderness.  Abdominal: He exhibits no distension and no mass. There is no tenderness. There is no rebound and no guarding.  Musculoskeletal: He exhibits no edema and no tenderness.  Lymphadenopathy:    He has no cervical adenopathy.  Neurological: He is alert and oriented to person, place, and time. He exhibits normal muscle tone. Coordination normal.  Skin: Skin is warm and dry. He is not diaphoretic. No erythema. No pallor.  Psychiatric: He has a normal mood and affect. His behavior is normal. Judgment and thought content normal.          Assessment & Plan:   HIV: Continue Complera, recheck viral load and CD4 count today  if they look good he can come back in several months if not is coming back in a few weeks. I spent greater than 25 minutes with the patient including greater than 50% of time in face to face counsel of the patient and in coordination of their care.    Asthma: revisit ? Need for  steroid inhaler  Former smoker: quit in January

## 2014-05-11 LAB — T-HELPER CELL (CD4) - (RCID CLINIC ONLY)
CD4 % Helper T Cell: 31 % — ABNORMAL LOW (ref 33–55)
CD4 T Cell Abs: 580 /uL (ref 400–2700)

## 2014-05-11 LAB — RPR

## 2014-05-12 LAB — HIV-1 RNA QUANT-NO REFLEX-BLD
HIV 1 RNA Quant: 1298 copies/mL — ABNORMAL HIGH (ref <20)
HIV-1 RNA Quant, Log: 3.11 {Log} — ABNORMAL HIGH (ref <1.30)

## 2014-05-15 ENCOUNTER — Other Ambulatory Visit: Payer: Self-pay | Admitting: *Deleted

## 2014-05-15 DIAGNOSIS — B20 Human immunodeficiency virus [HIV] disease: Secondary | ICD-10-CM

## 2014-05-15 MED ORDER — EMTRICITAB-RILPIVIR-TENOFOV DF 200-25-300 MG PO TABS: 1.0000 | ORAL_TABLET | Freq: Every day | ORAL | Status: DC

## 2014-05-18 ENCOUNTER — Telehealth: Payer: Self-pay | Admitting: Infectious Disease

## 2014-05-18 DIAGNOSIS — B20 Human immunodeficiency virus [HIV] disease: Secondary | ICD-10-CM

## 2014-05-18 DIAGNOSIS — Z9119 Patient's noncompliance with other medical treatment and regimen: Secondary | ICD-10-CM

## 2014-05-18 DIAGNOSIS — Z91199 Patient's noncompliance with other medical treatment and regimen due to unspecified reason: Secondary | ICD-10-CM

## 2014-05-18 NOTE — Telephone Encounter (Signed)
We need to add a HIV viral genotype to his blood work and he needs to come back next week and every 2 weeks until viral load is undetectable. Complera maybe WRONG medicien for him with him NOT fully suppressing his virus. Can we overbook him with me next Monday or Wed?  Probably need to change him to prezcobix and truvada

## 2014-05-19 NOTE — Telephone Encounter (Signed)
Lets do 19th

## 2014-05-19 NOTE — Telephone Encounter (Signed)
You are double booked 3 times Monday and Wednesday. The Genotype will not be back by then, would you like him to come in on 8/17 or 8/19?

## 2014-05-22 NOTE — Telephone Encounter (Signed)
Ok done

## 2014-05-31 ENCOUNTER — Other Ambulatory Visit: Payer: Self-pay | Admitting: Infectious Disease

## 2014-05-31 ENCOUNTER — Ambulatory Visit: Payer: Self-pay | Admitting: Infectious Disease

## 2014-05-31 ENCOUNTER — Other Ambulatory Visit (INDEPENDENT_AMBULATORY_CARE_PROVIDER_SITE_OTHER): Payer: Self-pay

## 2014-05-31 DIAGNOSIS — B20 Human immunodeficiency virus [HIV] disease: Secondary | ICD-10-CM

## 2014-06-01 LAB — T-HELPER CELL (CD4) - (RCID CLINIC ONLY)
CD4 % Helper T Cell: 27 % — ABNORMAL LOW (ref 33–55)
CD4 T Cell Abs: 700 /uL (ref 400–2700)

## 2014-06-02 LAB — HIV-1 RNA ULTRAQUANT REFLEX TO GENTYP+
HIV 1 RNA Quant: <20 copies/mL (ref <20)
HIV-1 RNA Quant, Log: <1.3 {Log} (ref <1.30)

## 2014-08-23 ENCOUNTER — Ambulatory Visit: Payer: Self-pay | Admitting: Infectious Disease

## 2015-06-06 ENCOUNTER — Telehealth: Payer: Self-pay | Admitting: *Deleted

## 2015-06-06 NOTE — Telephone Encounter (Signed)
Needing fax # to send ROI - pt moved to [facility]  Given fax #.  Received signed ROI from [facility].  Faxing records.

## 2015-11-24 IMAGING — CR DG CHEST 2V
2 series · 2 of 2 positions shown · non-contrast
Comparison: None.

CLINICAL DATA: Productive cough.  Fever.

EXAM:
CHEST  2 VIEW

[w chest pa]
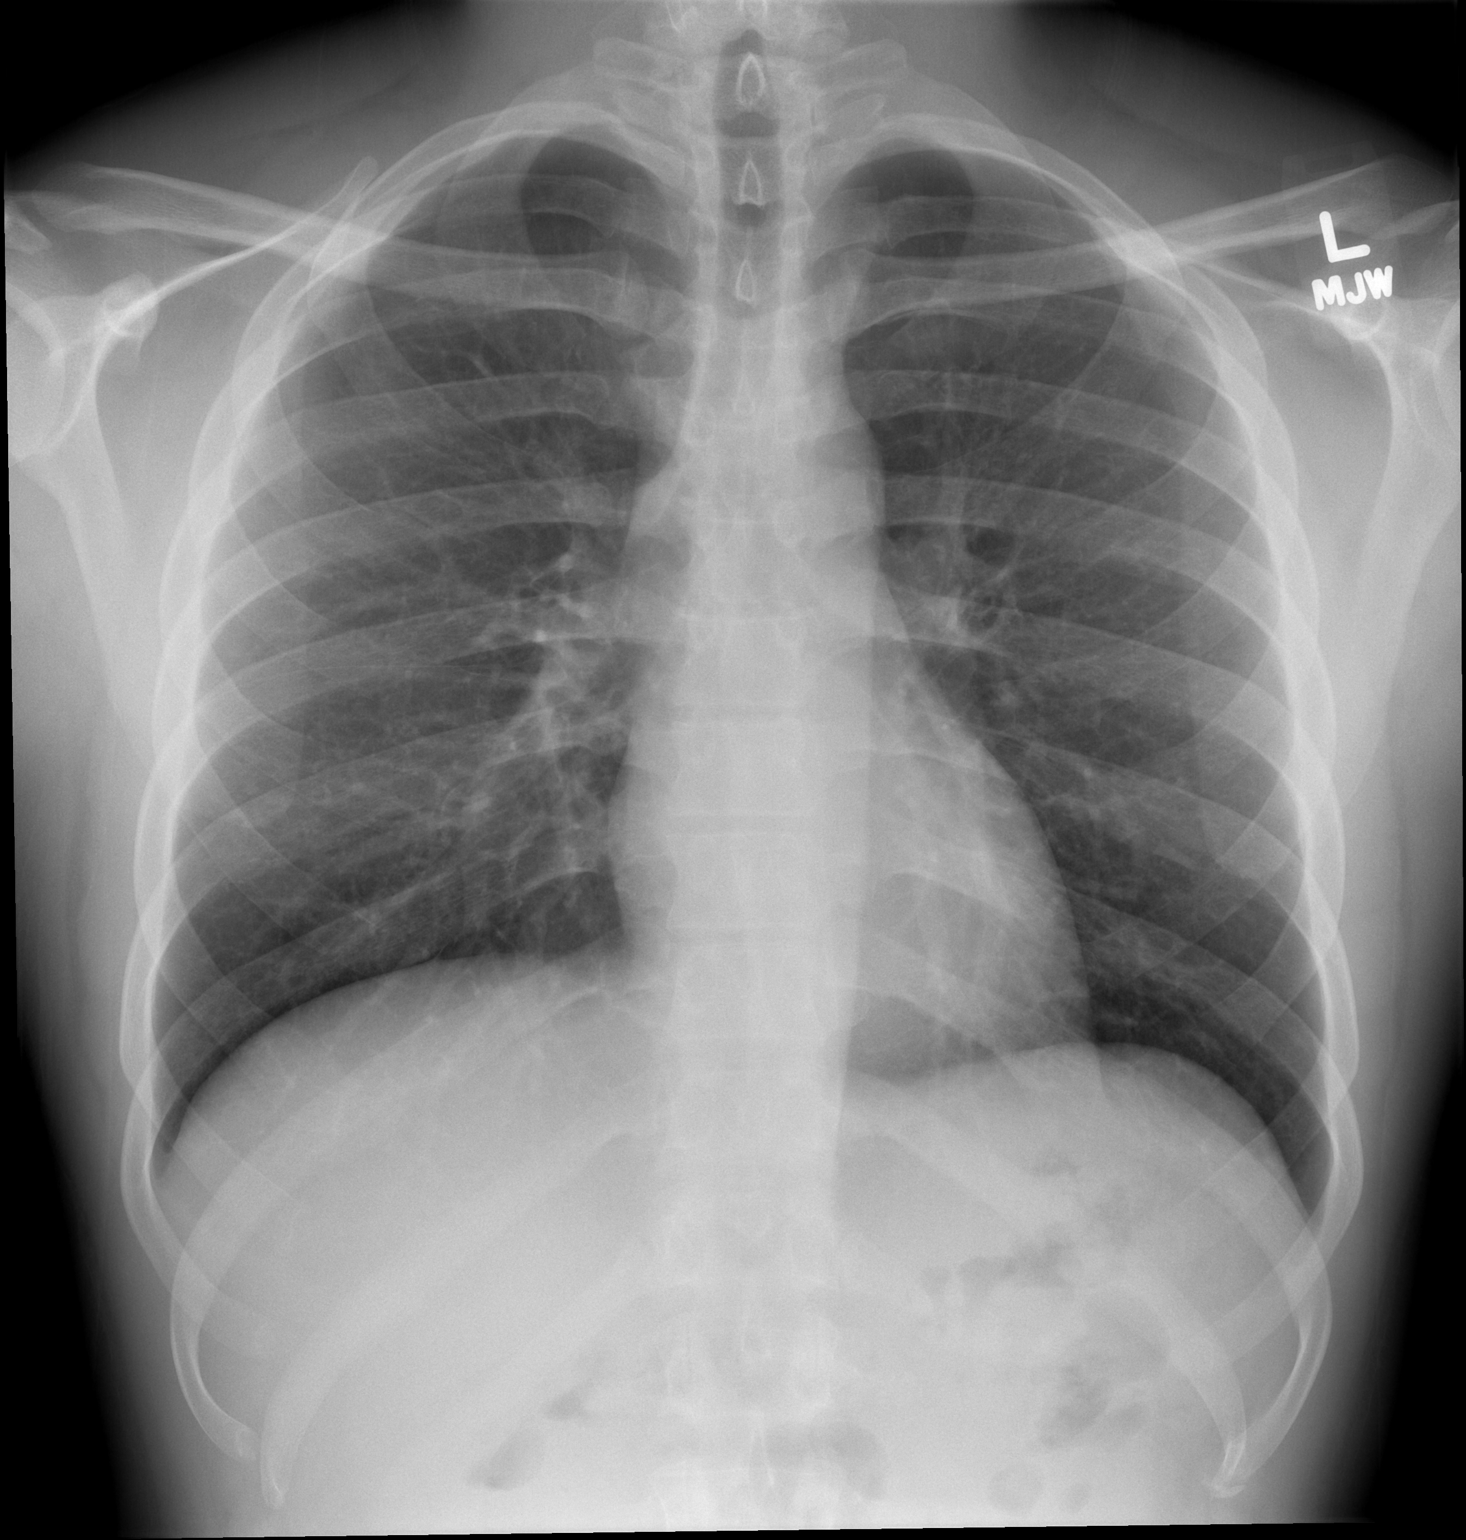

[w chest lat]
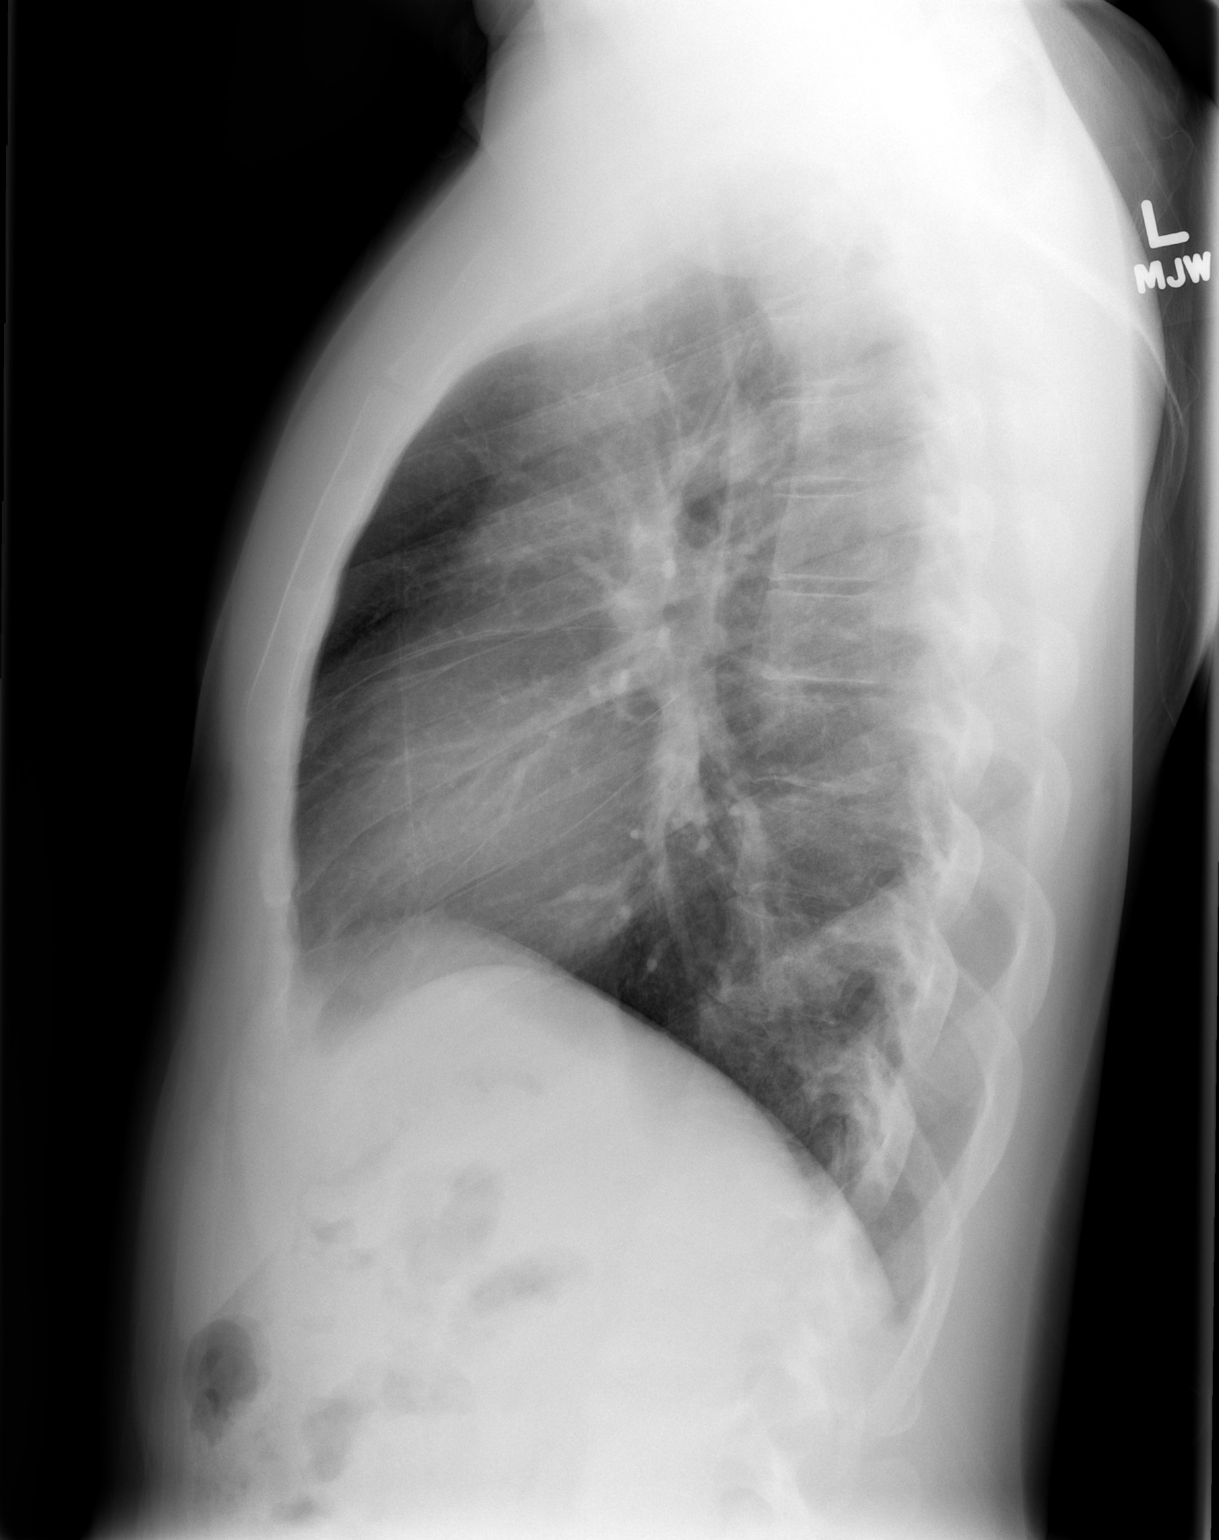

[2 of 2 positions shown; findings below may reference images not displayed]

FINDINGS: Heart size within normal limits.

No infiltrate, congestive heart failure or pneumothorax.
IMPRESSION: No active cardiopulmonary disease.

## 2019-08-29 ENCOUNTER — Other Ambulatory Visit: Payer: Self-pay

## 2019-08-29 DIAGNOSIS — Z20822 Contact with and (suspected) exposure to covid-19: Secondary | ICD-10-CM

## 2019-08-31 LAB — NOVEL CORONAVIRUS, NAA: SARS-CoV-2, NAA: NOT DETECTED

## 2019-09-15 ENCOUNTER — Encounter (HOSPITAL_COMMUNITY): Payer: Self-pay

## 2019-09-15 ENCOUNTER — Emergency Department (HOSPITAL_COMMUNITY): Payer: Self-pay

## 2019-09-15 ENCOUNTER — Observation Stay (HOSPITAL_COMMUNITY)
Admission: EM | Admit: 2019-09-15 | Discharge: 2019-09-16 | Disposition: A | Discharge status: Home / Self Care | Payer: Self-pay | Attending: Internal Medicine | Admitting: Internal Medicine

## 2019-09-15 DIAGNOSIS — R002 Palpitations: Secondary | ICD-10-CM | POA: Insufficient documentation

## 2019-09-15 DIAGNOSIS — F1490 Cocaine use, unspecified, uncomplicated: Secondary | ICD-10-CM | POA: Insufficient documentation

## 2019-09-15 DIAGNOSIS — Z20828 Contact with and (suspected) exposure to other viral communicable diseases: Secondary | ICD-10-CM | POA: Insufficient documentation

## 2019-09-15 DIAGNOSIS — R079 Chest pain, unspecified: Secondary | ICD-10-CM | POA: Diagnosis present

## 2019-09-15 DIAGNOSIS — Z886 Allergy status to analgesic agent status: Secondary | ICD-10-CM

## 2019-09-15 DIAGNOSIS — B20 Human immunodeficiency virus [HIV] disease: Secondary | ICD-10-CM | POA: Diagnosis present

## 2019-09-15 DIAGNOSIS — R0789 Other chest pain: Principal | ICD-10-CM | POA: Insufficient documentation

## 2019-09-15 DIAGNOSIS — J45909 Unspecified asthma, uncomplicated: Secondary | ICD-10-CM

## 2019-09-15 DIAGNOSIS — Z79899 Other long term (current) drug therapy: Secondary | ICD-10-CM

## 2019-09-15 DIAGNOSIS — F17210 Nicotine dependence, cigarettes, uncomplicated: Secondary | ICD-10-CM | POA: Insufficient documentation

## 2019-09-15 DIAGNOSIS — Z720 Tobacco use: Secondary | ICD-10-CM

## 2019-09-15 DIAGNOSIS — R7303 Prediabetes: Secondary | ICD-10-CM

## 2019-09-15 LAB — CBC
HCT: 44.9 % (ref 39.0–52.0)
HCT: 46 % (ref 39.0–52.0)
Hemoglobin: 15.1 g/dL (ref 13.0–17.0)
Hemoglobin: 15.1 g/dL (ref 13.0–17.0)
MCH: 28 pg (ref 26.0–34.0)
MCH: 27.9 pg (ref 26.0–34.0)
MCHC: 33.6 g/dL (ref 30.0–36.0)
MCHC: 32.8 g/dL (ref 30.0–36.0)
MCV: 83.1 fL (ref 80.0–100.0)
MCV: 85 fL (ref 80.0–100.0)
Platelets: 238 10*3/uL (ref 150–400)
Platelets: 265 10*3/uL (ref 150–400)
RBC: 5.4 MIL/uL (ref 4.22–5.81)
RBC: 5.41 MIL/uL (ref 4.22–5.81)
RDW: 13.9 % (ref 11.5–15.5)
RDW: 14 % (ref 11.5–15.5)
WBC: 7.8 10*3/uL (ref 4.0–10.5)
WBC: 5.8 10*3/uL (ref 4.0–10.5)
nRBC: 0 % (ref 0.0–0.2)
nRBC: 0 % (ref 0.0–0.2)

## 2019-09-15 LAB — LIPID PANEL
Cholesterol: 144 mg/dL (ref 0–200)
HDL: 66 mg/dL (ref >40)
LDL Cholesterol: 76 mg/dL (ref 0–99)
Total CHOL/HDL Ratio: 2.2 RATIO
Triglycerides: 11 mg/dL (ref <150)
VLDL: 2 mg/dL (ref 0–40)

## 2019-09-15 LAB — CREATININE, SERUM
Creatinine, Ser: 0.85 mg/dL (ref 0.61–1.24)
GFR calc Af Amer: >60 mL/min (ref >60)
GFR calc non Af Amer: >60 mL/min (ref >60)

## 2019-09-15 LAB — RAPID URINE DRUG SCREEN, HOSP PERFORMED
Amphetamines: NOT DETECTED
Barbiturates: NOT DETECTED
Benzodiazepines: NOT DETECTED
Cocaine: POSITIVE — AB
Opiates: NOT DETECTED
Tetrahydrocannabinol: POSITIVE — AB

## 2019-09-15 LAB — BASIC METABOLIC PANEL WITH GFR
Anion gap: 9 (ref 5–15)
BUN: 11 mg/dL (ref 6–20)
CO2: 21 mmol/L — ABNORMAL LOW (ref 22–32)
Calcium: 9.3 mg/dL (ref 8.9–10.3)
Chloride: 104 mmol/L (ref 98–111)
Creatinine, Ser: 0.94 mg/dL (ref 0.61–1.24)
GFR calc Af Amer: >60 mL/min
GFR calc non Af Amer: >60 mL/min
Glucose, Bld: 91 mg/dL (ref 70–99)
Potassium: 3.9 mmol/L (ref 3.5–5.1)
Sodium: 134 mmol/L — ABNORMAL LOW (ref 135–145)

## 2019-09-15 LAB — TROPONIN I (HIGH SENSITIVITY)
Troponin I (High Sensitivity): 13 ng/L
Troponin I (High Sensitivity): 29 ng/L — ABNORMAL HIGH (ref <18)
Troponin I (High Sensitivity): 19 ng/L — ABNORMAL HIGH
Troponin I (High Sensitivity): 15 ng/L (ref <18)
Troponin I (High Sensitivity): 7 ng/L (ref <18)

## 2019-09-15 LAB — HEMOGLOBIN A1C
Hgb A1c MFr Bld: 5.9 % — ABNORMAL HIGH (ref 4.8–5.6)
Mean Plasma Glucose: 122.63 mg/dL

## 2019-09-15 LAB — SARS CORONAVIRUS 2 (TAT 6-24 HRS): SARS Coronavirus 2: NEGATIVE

## 2019-09-15 MED ORDER — NITROGLYCERIN 2 % TD OINT
1.0000 [in_us] | TOPICAL_OINTMENT | Freq: Four times a day (QID) | TRANSDERMAL | Status: DC
  Administered 2019-09-15 – 2019-09-16 (×3): 1 [in_us] via TOPICAL
  Filled 2019-09-15: qty 30

## 2019-09-15 MED ORDER — SODIUM CHLORIDE 0.9% FLUSH: 3.0000 mL | Freq: Once | INTRAVENOUS | Status: DC

## 2019-09-15 MED ORDER — ENOXAPARIN SODIUM 40 MG/0.4ML ~~LOC~~ SOLN
40.0000 mg | SUBCUTANEOUS | Status: DC
  Administered 2019-09-15: 40 mg via SUBCUTANEOUS
  Filled 2019-09-15: qty 0.4

## 2019-09-15 MED ORDER — ACETAMINOPHEN 325 MG PO TABS
650.0000 mg | ORAL_TABLET | ORAL | Status: DC | PRN
  Administered 2019-09-15 – 2019-09-16 (×3): 650 mg via ORAL
  Filled 2019-09-15 (×3): qty 2

## 2019-09-15 MED ORDER — ONDANSETRON HCL 4 MG/2ML IJ SOLN
4.0000 mg | Freq: Four times a day (QID) | INTRAMUSCULAR | Status: DC | PRN
  Administered 2019-09-15 – 2019-09-16 (×2): 4 mg via INTRAVENOUS
  Filled 2019-09-15 (×2): qty 2

## 2019-09-15 NOTE — Progress Notes (Signed)
Chest pain has not worsened since previous progress note. Paged second after-hours MD. Will continue to monitor.

## 2019-09-15 NOTE — ED Provider Notes (Signed)
[facility] EMERGENCY DEPARTMENT Provider Note   CSN: [id number] Arrival date & time:        History   Chief Complaint Chief Complaint  Patient presents with  . Chest Pain  . Shortness of Breath    HPI Travis Herring is a 33 y.o. male with PMH significant for asthma and HIV not on his antiretroviral therapy since moving to [place] from [place] in August who presents to the ED with acute onset chest pain and shortness of breath while at work.  Patient works for the [employer] and leaves when he developed acute onset left-sided chest discomfort and shortness of breath symptoms.  He also felt lightheaded and needed to lay down on the ground.  He had consumed monster energy this morning and is unsure whether or not that contributed to his symptoms.  He also reports that his asthma has been acting up the past couple of days due to the cold weather.  Obtained history from EMS and patient reportedly was endorsing bilateral arm tingling and numbness.  After receiving albuterol in route, his shortness of breath symptoms improved, but the chest pain persisted.  He continues to endorse left arm tingling.  No abdominal discomfort, nausea or vomiting, diaphoresis, loss of consciousness, headache or dizziness, fever or chills, or recent illness.  EMS reports that he received Solu-Medrol, aspirin, nitro, and albuterol while en route.  Patient admits to significant tobacco use and occasional cocaine use, most recently 2 weeks ago.  He reports that his chest pain has improved slightly since using albuterol treatment and he is no longer feeling arm tingling or numbness.  Patient reports that he had a near syncopal event approximately 1 month ago when he felt similar symptoms of chest palpitations, chest tightness, shortness of breath, and lightheadedness.      HPI  Past Medical History:  Diagnosis Date  . Asthma     Patient Active Problem List   Diagnosis Date Noted  . HIV disease  (HCC) 12/29/2013  . Tattoo 12/29/2013    Past Surgical History:  Procedure Laterality Date  . APPENDECTOMY          Home Medications    Prior to Admission medications   Medication Sig Start Date End Date Taking? Authorizing Provider  Emtricitab-Rilpivir-Tenofovir 200-25-300 MG TABS Take 1 tablet by mouth daily. 05/15/14   Randall Hiss, MD    Family History Family History  Problem Relation Age of Onset  . Hypertension Mother   . Hypertension Sister   . Diabetes Maternal Grandmother   . Heart disease Maternal Grandmother   . Hypertension Maternal Grandmother   . Cancer Maternal Grandfather        colon  . Aneurysm Maternal Grandfather   . Heart disease Paternal Grandmother   . Hypertension Sister     Social History Social History   Tobacco Use  . Smoking status: Current Some Day Smoker    Packs/day: 0.25    Types: Cigarettes    Last attempt to quit: 10/13/2013    Years since quitting: 5.9  . Smokeless tobacco: Never Used  Substance Use Topics  . Alcohol use: Yes    Alcohol/week: 1.0 standard drinks    Types: 1 Cans of beer per week  . Drug use: Yes    Frequency: 7.0 times per week    Types: Marijuana    Comment: 1-3 blunts daily      Allergies   Aspirin   Review of Systems  Review of Systems  All other systems reviewed and are negative.    Physical Exam Updated Vital Signs BP (!) 140/96   Pulse 97   Temp 98.4 F (36.9 C) (Oral)   Resp 20   SpO2 99%   Physical Exam Vitals signs and nursing note reviewed. Exam conducted with a chaperone present.  Constitutional:      Appearance: Normal appearance.  HENT:     Head: Normocephalic and atraumatic.  Eyes:     General: No scleral icterus.    Extraocular Movements: Extraocular movements intact.     Conjunctiva/sclera: Conjunctivae normal.     Pupils: Pupils are equal, round, and reactive to light.  Cardiovascular:     Rate and Rhythm: Regular rhythm.     Pulses: Normal pulses.     Heart  sounds: Normal heart sounds.     Comments: Mildly tachycardic. Pulmonary:     Effort: Pulmonary effort is normal. No respiratory distress.     Breath sounds: Normal breath sounds. No wheezing or rales.  Abdominal:     General: Abdomen is flat. There is no distension.     Palpations: Abdomen is soft.     Tenderness: There is no abdominal tenderness. There is no guarding.  Musculoskeletal: Normal range of motion.  Skin:    General: Skin is dry.     Capillary Refill: Capillary refill takes less than 2 seconds.  Neurological:     General: No focal deficit present.     Mental Status: He is alert and oriented to person, place, and time.     GCS: GCS eye subscore is 4. GCS verbal subscore is 5. GCS motor subscore is 6.  Psychiatric:        Mood and Affect: Mood normal.        Behavior: Behavior normal.        Thought Content: Thought content normal.      ED Treatments / Results  Labs (all labs ordered are listed, but only abnormal results are displayed) Labs Reviewed  BASIC METABOLIC PANEL - Abnormal; Notable for the following components:      Result Value   Sodium 134 (*)    CO2 21 (*)    All other components within normal limits  TROPONIN I (HIGH SENSITIVITY) - Abnormal; Notable for the following components:   Troponin I (High Sensitivity) 29 (*)    All other components within normal limits  CBC  TROPONIN I (HIGH SENSITIVITY)    EKG EKG Interpretation  Date/Time:  Thursday September 15 2019 10:38:02 EST Ventricular Rate:  103 PR Interval:    QRS Duration: 80 QT Interval:  313 QTC Calculation: 410 R Axis:   75 Text Interpretation: Sinus tachycardia Biatrial enlargement Early repolarization pattern ST-t wave abnormality Artifact Abnormal ECG Confirmed by Carmin Muskrat [phone]) on 09/15/2019 10:50:36 AM   Radiology Dg Chest Portable 1 View  Result Date: 09/15/2019 CLINICAL DATA:  Chest pain and shortness of breath EXAM: PORTABLE CHEST 1 VIEW COMPARISON:  October 18, 2013 FINDINGS: The lungs are clear. The heart size and pulmonary vascularity are normal. No adenopathy. No pneumothorax. No bone lesions. IMPRESSION: No edema or consolidation. Electronically Signed   By: Lowella Grip III M.D.   On: 09/15/2019 11:01    Procedures Procedures (including critical care time)  Medications Ordered in ED Medications  sodium chloride flush (NS) 0.9 % injection 3 mL (has no administration in time range)     Initial Impression / Assessment and Plan / ED  Course  I have reviewed the triage vital signs and the nursing notes.  Pertinent labs & imaging results that were available during my care of the patient were reviewed by me and considered in my medical decision making (see chart for details).  Clinical Course as of Sep 14 1533  Thu Sep 15, 2019  1501 Troponin I (High Sensitivity)(!): 29 [GG]    Clinical Course User Index [GG] Lorelee NewGreen, Brix Brearley L, PA-C       Considered opportunistic infection given HIV off of antiretroviral therapy with unknown CD4 count, but patient is afebrile with normal white count.  DG chest was interpreted and demonstrates no acute cardiopulmonary disease.  No pneumothorax or consolidation concerning for pneumonia.  EKG is reviewed and shows early repolarization pattern, but no overt ischemia.  Initial troponin is within normal limits at 12. Patient reports symptom improvement with albuterol treatment and had reported worsening asthma symptoms in the past couple days, so this may have been exacerbation that has since resolved.  No wheezing appreciated on auscultation.  BMP is interpreted demonstrates no electrolyte abnormalities that could contribute to arrhythmia.  Patient's mother has an ICD and history of stroke, she endorses arrhythmia but is unsure of which one specifically.  Given patient's family history of cardiac disease in conjunction with his reports of palpitations and possible presyncopal episodes, recommend that he receive  further cardiac evaluation outpatient including but not limited to Holter monitor to assess for arrhythmia.   3:34 PM Repeat troponin increased from 12 to 29 and patient will be admitted for observation given positive delta troponin.  Patient currently denies any chest pain or shortness of breath symptoms and feels improved from his episode this morning.  Consulted with medicine, Dr. Marchia Bondoe, who will see patient and admit for observation.   Final Clinical Impressions(s) / ED Diagnoses   Final diagnoses:  Chest pain, unspecified type    ED Discharge Orders    None       Lorelee NewGreen, Siarra Gilkerson L, PA-C 09/15/19 1621    Gerhard MunchLockwood, Robert, MD 09/16/19 [phone]

## 2019-09-15 NOTE — ED Triage Notes (Addendum)
Per [facility], pt reports acute onset of CP and SOB while at work. Hx of asthma, states asthma has been "flaring up" the last two days, using inhaler w/no relief, L arm numbness, BBS clear   125 mg Solumedrol 324 mg ASA Nitro 0.4 mg x1  142/100 115 24 100% RA  97.7  18g LFA

## 2019-09-15 NOTE — Progress Notes (Signed)
Pt complains of worsening chest pain since beginning of shift. Obtained an EKG, result shows ST abnormality with possible ST depression. Chest pain is repeatable and worse with pressure. Paged first after-hours MD. Will continue to monitor.

## 2019-09-15 NOTE — ED Notes (Signed)
Dinner tray ordered to go to  [address] 

## 2019-09-15 NOTE — ED Notes (Signed)
Dinner tray ordered to go to  [employer]

## 2019-09-15 NOTE — H&P (Addendum)
Date: 09/15/2019               Patient Name:  Travis Herring MRN: [id number]  DOB: Apr 08, 1986 Age / Sex: 33 y.o., male   PCP: Daiva Eves, Lisette Grinder, MD         Medical Service: Internal Medicine Teaching Service         Attending Physician: Dr. Inez Catalina, MD    First Contact: Dr. Marchia Bond Pager: [phone]  Second Contact: Dr. Nedra Hai Pager: [phone]       After Hours (After 5p/  First Contact Pager: [phone]  weekends / holidays): Second Contact Pager: [phone]   Chief Complaint: Chest Pain  History of Present Illness:  Travis Herring is a 33 year old male with pertinent past medical history of HIV (not on antiretroviral therapy), asthma, and cocaine use disorder, who presented to [facility], ED with acute onset chest pain while at work today. Patient states that he recently moved from [place] to [place] in August. He believes that his asthma symptoms have been worse since moving, which has caused him to have intermittent, mild chest pain that is relieved by using his albuterol inhaler. This morning the patient states that he drank an energy drink and subsequently started having central chest pain/pressure. He thought this was due to his asthma so he took 4 puffs of his albuterol and become diaphoretic with associated palpitations. Patient states that he does use tobaccos, mariajuana, and cocaine, but states he hasn't used cocaine in 2 weeks. Patients states that his pain resolved while sitting in the ED since 9:30 AM.   ED work up was significant for an elevated Troponin of 29 with an EKG showing sinus tachycardia, bilateral enlarged atrium, T-wave abnormalities.     Social:  Social History   Socioeconomic History  . Marital status: Single    Spouse name: Not on file  . Number of children: Not on file  . Years of education: Not on file  . Highest education level: Not on file  Occupational History  . Not on file  Social Needs  . Financial resource strain: Not on file  . Food  insecurity    Worry: Not on file    Inability: Not on file  . Transportation needs    Medical: Not on file    Non-medical: Not on file  Tobacco Use  . Smoking status: Current Some Day Smoker    Packs/day: 0.25    Types: Cigarettes    Last attempt to quit: 10/13/2013    Years since quitting: 5.9  . Smokeless tobacco: Never Used  Substance and Sexual Activity  . Alcohol use: Yes    Alcohol/week: 1.0 standard drinks    Types: 1 Cans of beer per week  . Drug use: Yes    Frequency: 7.0 times per week    Types: Marijuana    Comment: 1-3 blunts daily   . Sexual activity: Yes    Partners: Female, Male  Lifestyle  . Physical activity    Days per week: Not on file    Minutes per session: Not on file  . Stress: Not on file  Relationships  . Social Musician on phone: Not on file    Gets together: Not on file    Attends religious service: Not on file    Active member of club or organization: Not on file    Attends meetings of clubs or organizations: Not on file  Relationship status: Not on file  . Intimate partner violence    Fear of current or ex partner: Not on file    Emotionally abused: Not on file    Physically abused: Not on file    Forced sexual activity: Not on file  Other Topics Concern  . Not on file  Social History Narrative  . Not on file     Family History:  Family History  Problem Relation Age of Onset  . Hypertension Mother   . Hypertension Sister   . Diabetes Maternal Grandmother   . Heart disease Maternal Grandmother   . Hypertension Maternal Grandmother   . Cancer Maternal Grandfather        colon  . Aneurysm Maternal Grandfather   . Heart disease Paternal Grandmother   . Hypertension Sister      Meds: (has not been taking this medication since July?) Current Meds  Medication Sig  . Emtricitab-Rilpivir-Tenofovir 200-25-300 MG TABS Take 1 tablet by mouth daily.     Allergies: Allergies as of 09/15/2019 - Review Complete  09/15/2019  Allergen Reaction Noted  . Aspirin Nausea Only 11/17/2011   Past Medical History:  Diagnosis Date  . Asthma      Review of Systems: A complete ROS was negative except as per HPI.   Physical Exam: Blood pressure (!) 145/93, pulse 96, temperature 99.1 F (37.3 C), temperature source Oral, resp. rate 20, SpO2 99 %. Physical Exam  Constitutional: He is oriented to person, place, and time and well-developed, well-nourished, and in no distress.  HENT:  Head: Normocephalic and atraumatic.  Eyes: EOM are normal.  Neck: Normal range of motion. No JVD present.  Cardiovascular: Normal rate, regular rhythm, normal heart sounds and intact distal pulses. Exam reveals no gallop and no friction rub.  No murmur heard. Pulmonary/Chest: Effort normal and breath sounds normal. No respiratory distress. He exhibits no tenderness.  Abdominal: Soft. He exhibits no distension. There is no abdominal tenderness.  Musculoskeletal: Normal range of motion.        General: No tenderness or edema.  Neurological: He is alert and oriented to person, place, and time.  Skin: Skin is warm and dry.   CBC Latest Ref Rng & Units 09/15/2019 09/15/2019 05/10/2014  WBC 4.0 - 10.5 K/uL 5.8 7.8 5.6  Hemoglobin 13.0 - 17.0 g/dL 15.1 15.1 14.9  Hematocrit 39.0 - 52.0 % 46.0 44.9 43.7  Platelets 150 - 400 K/uL 265 238 235   CMP Latest Ref Rng & Units 09/15/2019 09/15/2019 05/10/2014  Glucose 70 - 99 mg/dL - 91 92  BUN 6 - 20 mg/dL - 11 10  Creatinine 0.61 - 1.24 mg/dL 0.85 0.94 0.94  Sodium 135 - 145 mmol/L - 134(L) 140  Potassium 3.5 - 5.1 mmol/L - 3.9 4.6  Chloride 98 - 111 mmol/L - 104 103  CO2 22 - 32 mmol/L - 21(L) 29  Calcium 8.9 - 10.3 mg/dL - 9.3 9.9  Total Protein 6.0 - 8.3 g/dL - - 7.5  Total Bilirubin 0.2 - 1.2 mg/dL - - 0.8  Alkaline Phos 39 - 117 U/L - - 65  AST 0 - 37 U/L - - 16  ALT 0 - 53 U/L - - 14   Delta troponin: 13>29>19  EKG: personally reviewed my interpretation is Sinus  tachycardia, Biatrial enlargement, Early repolarization pattern, ST-t wave abnormality   CXR: personally reviewed my interpretation is No edema or consolidation.  Assessment & Plan by Problem: Active Problems:   HIV disease ([facility])  Chest pain in adult   Asthma  Mr. Forrester is a 33 y.o. male with a pertinent past medical history of cocaine use disorder, HIV, asthma, who presented with chest pain likely etiology multifactorial in the setting of energy drink ingestions, albuterol-use, and cocaine-use causing coronary vasospasm vs rate-dependant ischemia.   #Chest Pain: - Repeat EKG  - Asprin given in EMS - Repeat troponin of 19 (decreased from 29) - Nitroglycerine ointment  - Cardiac monitoring  - Lipid panel is WNL  #Cocain use disorder: - UDS positive for cocaine   #HIV: - patient did not consent to restarting ART at this time.  - Curbside ID to try to reinitiate ART. - CD4 and viral ordered  #Prediabetes: - Hgb A1c of 5.9  Diet: Heart healthy VTE: Enoxaparin  IVF: none Code: Full  Dispo: Admit patient to Observation with expected length of stay less than 2 midnights.  Signed: Dellia Cloudoe, Siddhi Dornbush, MD 09/15/2019, 6:43 PM  Pager: [phone]

## 2019-09-15 NOTE — Progress Notes (Signed)
Patient seen at the bedside after being paged about chest pain.  Upon entering the room, the patient was asleep.  When I woke him up, the patient stated "this is the first time of being able to sleep today".  The patient's chest pain is 5 out of 10 currently but has improved since coming in. He states the pain is localized around his left breast. The pain does not radiate.  No shortness of breath.  On physical exam, he had normal S1-S2, no murmurs rubs or gallops appreciated.  He had point tenderness to light palpation of the left chest Spanos.  It does not appear that the patient is having ACS at this time. It appears his chest pain is likely musculoskeletal in etiology.  Patient's most recent EKG looks unchanged from prior.  Additional troponin ordered.  Patient currently has PRN Tylenol and nitroglycerine ointment ordered.  Patient can continue using these modalities. If his chest pain worsens, will consider giving an additional analgesic.  Earlene Plater, MD Internal Medicine, PGY1 Pager: [phone]  09/15/2019,10:08 PM

## 2019-09-16 DIAGNOSIS — R109 Unspecified abdominal pain: Secondary | ICD-10-CM

## 2019-09-16 DIAGNOSIS — K5900 Constipation, unspecified: Secondary | ICD-10-CM

## 2019-09-16 DIAGNOSIS — G8929 Other chronic pain: Secondary | ICD-10-CM

## 2019-09-16 LAB — BASIC METABOLIC PANEL
Anion gap: 9 (ref 5–15)
BUN: 15 mg/dL (ref 6–20)
CO2: 24 mmol/L (ref 22–32)
Calcium: 9.3 mg/dL (ref 8.9–10.3)
Chloride: 100 mmol/L (ref 98–111)
Creatinine, Ser: 0.85 mg/dL (ref 0.61–1.24)
GFR calc Af Amer: >60 mL/min (ref >60)
GFR calc non Af Amer: >60 mL/min (ref >60)
Glucose, Bld: 109 mg/dL — ABNORMAL HIGH (ref 70–99)
Potassium: 3.7 mmol/L (ref 3.5–5.1)
Sodium: 133 mmol/L — ABNORMAL LOW (ref 135–145)

## 2019-09-16 LAB — HIV-1 RNA QUANT-NO REFLEX-BLD
HIV 1 RNA Quant: 2770 copies/mL
LOG10 HIV-1 RNA: 3.442 log10copy/mL

## 2019-09-16 LAB — T-HELPER CELLS (CD4) COUNT (NOT AT ARMC)
CD4 % Helper T Cell: 12 % — ABNORMAL LOW (ref 33–65)
CD4 T Cell Abs: 117 /uL — ABNORMAL LOW (ref 400–1790)

## 2019-09-16 LAB — CBC
HCT: 43.8 % (ref 39.0–52.0)
Hemoglobin: 14.3 g/dL (ref 13.0–17.0)
MCH: 27.2 pg (ref 26.0–34.0)
MCHC: 32.6 g/dL (ref 30.0–36.0)
MCV: 83.4 fL (ref 80.0–100.0)
Platelets: 261 10*3/uL (ref 150–400)
RBC: 5.25 MIL/uL (ref 4.22–5.81)
RDW: 13.8 % (ref 11.5–15.5)
WBC: 8.3 10*3/uL (ref 4.0–10.5)
nRBC: 0 % (ref 0.0–0.2)

## 2019-09-16 MED ORDER — PANTOPRAZOLE SODIUM 40 MG PO TBEC
40.0000 mg | DELAYED_RELEASE_TABLET | Freq: Every day | ORAL | Status: DC
  Filled 2019-09-16: qty 1

## 2019-09-16 MED ORDER — SULFAMETHOXAZOLE-TRIMETHOPRIM 400-80 MG PO TABS: 1.0000 | ORAL_TABLET | Freq: Every day | ORAL | 0 refills | Status: DC

## 2019-09-16 MED ORDER — BIKTARVY 50-200-25 MG PO TABS: 1.0000 | ORAL_TABLET | Freq: Every day | ORAL | 0 refills | Status: DC

## 2019-09-16 MED ORDER — POLYETHYLENE GLYCOL 3350 17 G PO PACK
17.0000 g | PACK | Freq: Every day | ORAL | Status: DC
  Administered 2019-09-16: 17 g via ORAL
  Filled 2019-09-16: qty 1

## 2019-09-16 MED FILL — BIKTARVY 50-200-25 MG TABS: 50-200-25 | 30 days supply | Qty: 30 | Fill #0

## 2019-09-16 MED FILL — SULFAMETHOXAZOLE-TMP SS TAB: 400-80 | 30 days supply | Qty: 30 | Fill #0

## 2019-09-16 NOTE — Discharge Instructions (Signed)

## 2019-09-16 NOTE — Progress Notes (Signed)
Paged 4th MD on summary for Doctors note

## 2019-09-16 NOTE — Progress Notes (Signed)
Paged Attending twice for a Doctors note for being out of work waiting on a response.

## 2019-09-16 NOTE — Discharge Summary (Signed)
Name: Travis Herring MRN: [id number] DOB: [birth year] 33 y.o. PCP: Tommy Medal, Lavell Islam, MD  Date of Admission: 09/15/2019 10:32 AM Date of Discharge: 09/16/2019  Attending Physician: Bartholomew Crews, MD  Discharge Diagnosis: 1. Atypical Chest Pain  Discharge Medications: Allergies as of 09/16/2019      Reactions   Aspirin Nausea Only      Medication List    TAKE these medications   Biktarvy 50-200-25 MG Tabs tablet Generic drug: bictegravir-emtricitabine-tenofovir AF Take 1 tablet by mouth daily. Notes to patient: 12/5   sulfamethoxazole-trimethoprim 400-80 MG tablet Commonly known as: Bactrim Take 1 tablet by mouth daily.       Disposition and follow-up:   Travis Herring was discharged from [facility] in Good condition.  At the hospital follow up visit please address:  1.  HIV: - Check for syphilis, hepatitis - Ensure he f/u with ID and restart his HIV medications  2.  Labs / imaging needed at time of follow-up: RPR/Hepatitis panel  3.  Pending labs/ test needing follow-up: HIV Viral Load  Follow-up Appointments: Follow-up Information    Tommy Medal, Lavell Islam, MD. Go on 09/20/2019.   Specialty: Infectious Diseases Why: at 9 am Contact information: [address] [place] [address] [phone]           Hospital Course by problem list: 1. Chest Pain: Travis Herring is a 33 yo M w/ PMH of cocaine use, asthma, untreated HIV who presented w/ atypical chest pain after drinking monster energy drink and overusing his asthma inhaler. He was found to have non-specific ST changes on EKG and troponin change from 13 to 29. He was admitted for observation. His UDS was positive for cocaine and his chest pain was attributed to cocaine use and over-consumption of caffeine. Discharged with recommendation to stop cocaine use.  2. HIV: Presented w/ diagnosis of untreated HIV. His CD4 count was noted to be at 117. Discharged with  trimethoprim-sulfamethoxazole for PCP prophylaxis and close follow up with ID clinic.  Discharge Vitals:   BP 128/84 (BP Location: Right Arm)   Pulse 76   Temp 98.1 F (36.7 C) (Oral)   Resp 19   Wt 70.8 kg   SpO2 100%   BMI 23.05 kg/m   Pertinent Labs, Studies, and Procedures:  Drugs of Abuse     Component Value Date/Time   LABOPIA NONE DETECTED 09/15/2019 1645   COCAINSCRNUR POSITIVE (A) 09/15/2019 1645   LABBENZ NONE DETECTED 09/15/2019 1645   AMPHETMU NONE DETECTED 09/15/2019 1645   THCU POSITIVE (A) 09/15/2019 1645   LABBARB NONE DETECTED 09/15/2019 1645    CBC    Component Value Date/Time   WBC 8.3 09/16/2019 0448   RBC 5.25 09/16/2019 0448   HGB 14.3 09/16/2019 0448   HCT 43.8 09/16/2019 0448   PLT 261 09/16/2019 0448   MCV 83.4 09/16/2019 0448   MCH 27.2 09/16/2019 0448   MCHC 32.6 09/16/2019 0448   RDW 13.8 09/16/2019 0448   LYMPHSABS 1.7 05/10/2014 1520   MONOABS 0.4 05/10/2014 1520   EOSABS 0.1 05/10/2014 1520   BASOSABS 0.0 05/10/2014 1520   PORTABLE CHEST 1 VIEW  COMPARISON:  October 18, 2013  FINDINGS: The lungs are clear. The heart size and pulmonary vascularity are normal. No adenopathy. No pneumothorax. No bone lesions.  IMPRESSION: No edema or consolidation.   Discharge Instructions: Discharge Instructions    Diet - low sodium heart healthy   Complete by: As directed  Diet - low sodium heart healthy   Complete by: As directed    Discharge instructions   Complete by: As directed    You were hospitalized for chest pain. Thank you for allowing [place] to be part of your care.   We arranged for you to follow up at:   1) Infectious Disease    Please note these changes made to your medications:   Please START taking: none    Please STOP taking: none   Please make sure to follow up with infectious disease  Please call our clinic if you have any questions or concerns, we may be able to help and keep you from a long and expensive  emergency room wait. Our clinic and after hours phone number is [phone], the best time to call is Monday through Friday 9 am to 4 pm but there is always someone available 24/7 if you have an emergency. If you need medication refills please notify your pharmacy one week in advance and they will send [place] a request.   Discharge instructions   Complete by: As directed    You were hospitalized for Chest Pain. Thank you for allowing [place] to be part of your care.   We arranged for you to follow up at:   1) Dr. Clinton Gallant office on Tuesday 09/20/2019 at 9 am.   Please note these changes made to your medications:   Please START taking:   1) Bactrim DS 400-80 mg daily   Please STOP taking: none  Please make sure to follow up with Dr. Daiva Eves this week.  Please call our clinic if you have any questions or concerns, we may be able to help and keep you from a long and expensive emergency room wait. Our clinic and after hours phone number is [phone], the best time to call is Monday through Friday 9 am to 4 pm but there is always someone available 24/7 if you have an emergency. If you need medication refills please notify your pharmacy one week in advance and they will send [place] a request.   Increase activity slowly   Complete by: As directed    Increase activity slowly   Complete by: As directed       Signed: Dellia Cloud, MD 09/16/2019, 3:02 PM   Pager: [phone]

## 2019-09-16 NOTE — TOC Progression Note (Signed)
Transition of Care [facility]) - Progression Note    Patient Details  Name: Travis Herring MRN: [id number] Date of Birth: 18-Apr-1986  Transition of Care [facility]) CM/SW Contact  Zenon Mayo, RN Phone Number: 09/16/2019, 1:06 PM  Clinical Narrative:    Patient with insurance with BCBS but does not have his information with him, he just started new job.  States phone is [phone] and he works with [employer].  His meds are filled by [facility] pharmacy. He will only need to pay 4.00 per Olivia Mackie with [facility]. One of the meds was an over ride.        Expected Discharge Plan and Services           Expected Discharge Date: 09/16/19                                     Social Determinants of Health (SDOH) Interventions    Readmission Risk Interventions No flowsheet data found.

## 2019-09-16 NOTE — Progress Notes (Signed)
Patient was informed about discharge waiting on TCP to deliver meds the Ride will be called.

## 2019-09-16 NOTE — Progress Notes (Signed)
Subjective:   Patient was seen this morning on rounds. Patient had several episodes of chest pain overnight. The chest pain he describes is different then the pain he described when he was initially admitted. He states that he has had lest-sided chest pain around his left breast that is point tender. Otherwise, his original chest pain symptoms have improved since being admitted.  Travis Herring was examined and evaluated at bedside this am. He complains of headache this morning. He also mentions that he had some abdominal discomfort this morning, complaining of constipation with lack of bowel movement. Able to tolerate breakfast this morning. Mentions wanting to get back on anti-retroviral therapy. States he wants to re-establish care with Dr.Van Dam.  Objective:  Vital signs in last 24 hours: Vitals:   09/15/19 1940 09/16/19 0005 09/16/19 0424 09/16/19 0426  BP: (!) 140/101 134/80 130/84   Pulse: 99 90 81   Resp: 20 20 18    Temp: 99.1 F (37.3 C) 98.8 F (37.1 C) 98.7 F (37.1 C)   TempSrc: Oral Oral Oral   SpO2: 100% 100% 100%   Weight:    70.8 kg    Physical Exam: Physical Exam  Constitutional: He is oriented to person, place, and time and well-developed, well-nourished, and in no distress.  HENT:  Head: Normocephalic and atraumatic.  Eyes: EOM are normal.  Neck: Normal range of motion.  Cardiovascular: Normal rate, regular rhythm, normal heart sounds and intact distal pulses. Exam reveals no gallop and no friction rub.  No murmur heard. Pulmonary/Chest: Effort normal and breath sounds normal. No respiratory distress. He exhibits no tenderness.  Abdominal: Soft. Bowel sounds are normal. He exhibits no distension. There is abdominal tenderness. There is no rebound and no guarding.  Musculoskeletal: Normal range of motion.        General: No tenderness or edema.  Neurological: He is alert and oriented to person, place, and time.  nonspecific paresthesias   Skin: Skin is warm and  dry.   Pertinent labs/Imaging: CBC Latest Ref Rng & Units 09/16/2019 09/15/2019 09/15/2019  WBC 4.0 - 10.5 K/uL 8.3 5.8 7.8  Hemoglobin 13.0 - 17.0 g/dL 14.3 15.1 15.1  Hematocrit 39.0 - 52.0 % 43.8 46.0 44.9  Platelets 150 - 400 K/uL 261 265 238   CMP Latest Ref Rng & Units 09/16/2019 09/15/2019 09/15/2019  Glucose 70 - 99 mg/dL 109(H) - 91  BUN 6 - 20 mg/dL 15 - 11  Creatinine 0.61 - 1.24 mg/dL 0.85 0.85 0.94  Sodium 135 - 145 mmol/L 133(L) - 134(L)  Potassium 3.5 - 5.1 mmol/L 3.7 - 3.9  Chloride 98 - 111 mmol/L 100 - 104  CO2 22 - 32 mmol/L 24 - 21(L)  Calcium 8.9 - 10.3 mg/dL 9.3 - 9.3  Total Protein 6.0 - 8.3 g/dL - - -  Total Bilirubin 0.2 - 1.2 mg/dL - - -  Alkaline Phos 39 - 117 U/L - - -  AST 0 - 37 U/L - - -  ALT 0 - 53 U/L - - -    Assessment/Plan:  Active Problems:   HIV disease ([facility])   Chest pain in adult   Asthma  Patient Summary: Mr. Andres is a 33 year old male with a pertinent past medical history of cocaine use disorder, HIV, asthma, who presented to [facility] ED with chest pain that has resolved at this time.  #Chest Pain: -Patient has atypical chest pain that is unlikely secondary to ACS.  Patient had multiple episodes of chest pain overnight  that were likely MSK in origin, considering he has point tenderness to his left breast.  Pain is in a different location than his original complaint. - Repeat troponin has trended downward.  Most recent troponin of 7 - EKG shows sinus rhythm but otherwise unchanged from previous EKG. -Lipid panel is within normal limits.  Total cholesterol of 144, triglycerides of 11, LDL of 76. - Patient is stable for discharge at this time.  #HIV: - CD4 of 117, will discharge patient on Bactrim Ds 400-80 mg daily - Viral load pending. - Set up out patient ID f/u appt. For 12/08 at 9 am  #Abdominal pain:  - abdominal pain appears to be chronic, will need outpatient follow up.   Diet: Heart healthy IVF: Enoxaparin VTE: None  Code: Full  Dispo: Anticipated discharge today.   Dellia Cloud, MD 09/16/2019, 5:34 AM Pager: [phone]

## 2019-09-16 NOTE — Progress Notes (Signed)
ID Pharmacy Progress Note  Mr. Blas has been approved for a 30-day supply of Biktarvy from [employer].    BIN#: [id number] PCN: [id number] GROUP: [id number] MEMBER#:[id number]  This medication will be filled through the [employer] for 0 copay and delivered to the bedside prior to discharge.    Jimmy Footman, PharmD, BCPS, BCIDP Infectious Diseases Clinical Pharmacist Phone: [phone] 09/16/2019 12:26 PM

## 2019-09-16 NOTE — Progress Notes (Signed)
CCMD called regarding pt ekg reporting abnormality in V3 lead. Compared this EKG to the one taken two hours previously and noted no change. Will continue to monitor.

## 2019-09-20 ENCOUNTER — Telehealth: Payer: Self-pay | Admitting: Pharmacy Technician

## 2019-09-20 ENCOUNTER — Ambulatory Visit (INDEPENDENT_AMBULATORY_CARE_PROVIDER_SITE_OTHER): Payer: Self-pay | Admitting: Infectious Disease

## 2019-09-20 ENCOUNTER — Encounter: Payer: Self-pay | Admitting: *Deleted

## 2019-09-20 ENCOUNTER — Encounter: Payer: Self-pay | Admitting: Infectious Disease

## 2019-09-20 ENCOUNTER — Other Ambulatory Visit: Payer: Self-pay

## 2019-09-20 VITALS — BP 137/82 | HR 89 | Wt 164.6 lb

## 2019-09-20 DIAGNOSIS — R079 Chest pain, unspecified: Secondary | ICD-10-CM

## 2019-09-20 DIAGNOSIS — B20 Human immunodeficiency virus [HIV] disease: Secondary | ICD-10-CM

## 2019-09-20 DIAGNOSIS — F1490 Cocaine use, unspecified, uncomplicated: Secondary | ICD-10-CM

## 2019-09-20 DIAGNOSIS — Z23 Encounter for immunization: Secondary | ICD-10-CM

## 2019-09-20 DIAGNOSIS — J45909 Unspecified asthma, uncomplicated: Secondary | ICD-10-CM

## 2019-09-20 DIAGNOSIS — L818 Other specified disorders of pigmentation: Secondary | ICD-10-CM

## 2019-09-20 HISTORY — DX: Cocaine use, unspecified, uncomplicated: F14.90

## 2019-09-20 MED ORDER — SULFAMETHOXAZOLE-TRIMETHOPRIM 800-160 MG PO TABS: 1.0000 | ORAL_TABLET | Freq: Every day | ORAL | 11 refills | Status: DC

## 2019-09-20 MED ORDER — BIKTARVY 50-200-25 MG PO TABS: 1.0000 | ORAL_TABLET | Freq: Every day | ORAL | 0 refills | Status: DC

## 2019-09-20 MED ORDER — FLUTICASONE-SALMETEROL 100-50 MCG/DOSE IN AEPB: 1.0000 | INHALATION_SPRAY | Freq: Two times a day (BID) | RESPIRATORY_TRACT | 11 refills | Status: DC

## 2019-09-20 NOTE — Telephone Encounter (Signed)
[facility] Patient Advocate Encounter   Patient has been approved for [employer] Advancing Access Patient Assistance Program for [place] for 30 day coverage. This assistance will make the patient's copay $0.  I have spoken with the patient and they will have their medications mailed from [facility]. He will get Advair from [facility] and Bactrim at [facility] with his pending UMAP. His temp agency will be offering insurance in the new year. I will call [facility] and get everything setup for him.   The billing information is: Member ID: [id number] [place]: [id number] PCN: [id number] Group: [id number]  Patient knows to call the office with questions or concerns or if he does not receive his medication in the mail.   Bartholomew Crews, CPhT Specialty Pharmacy Patient [facility] for Infectious Disease Phone: [phone] Fax: [phone] 09/20/2019 9:42 AM

## 2019-09-20 NOTE — Progress Notes (Signed)
Subjective:  Chief complaint: re-establish for HIV care   Patient ID: Travis Herring, male    DOB: [birth year], 33 y.o.   MRN: [id number]  HPI  33 year old [place] American man who was previously in care in [place] and followed by me having been most recently on Complera with me. He transferred care to [place] and was on STRIBILD for some time. He was visiting his grandmother in [place] who ultimately died from heart attack. He states he has been off medications for nearly a year with exception of one month in the  Spring. He was admitted for chest pain believed due to cocaine though he denies recent use though admits use months ago.  He smokes cigars but no cigarettes.  While in the hospital he was found to have CD4 less than 200, VL in 2k range. Started on Biktarvy and bactrim.     Past Medical History:  Diagnosis Date  . Asthma   . Cocaine use 09/20/2019    Past Surgical History:  Procedure Laterality Date  . APPENDECTOMY      Family History  Problem Relation Age of Onset  . Hypertension Mother   . Hypertension Sister   . Diabetes Maternal Grandmother   . Heart disease Maternal Grandmother   . Hypertension Maternal Grandmother   . Cancer Maternal Grandfather        colon  . Aneurysm Maternal Grandfather   . Heart disease Paternal Grandmother   . Hypertension Sister       Social History   Socioeconomic History  . Marital status: Single    Spouse name: Not on file  . Number of children: Not on file  . Years of education: Not on file  . Highest education level: Not on file  Occupational History  . Not on file  Social Needs  . Financial resource strain: Not on file  . Food insecurity    Worry: Not on file    Inability: Not on file  . Transportation needs    Medical: Not on file    Non-medical: Not on file  Tobacco Use  . Smoking status: Current Some Day Smoker    Packs/day: 0.25    Types: Cigarettes, Cigars    Last attempt to quit: 10/13/2013    Years since  quitting: 5.9  . Smokeless tobacco: Never Used  . Tobacco comment: black and milds; 3 per day  Substance and Sexual Activity  . Alcohol use: Yes    Alcohol/week: 1.0 standard drinks    Types: 1 Cans of beer per week    Comment: occassionally   . Drug use: Yes    Frequency: 7.0 times per week    Types: Marijuana    Comment: 1-3 blunts daily; cocaine occassionally   . Sexual activity: Not Currently    Partners: Female, Male    Comment: offered condoms 09/2019  Lifestyle  . Physical activity    Days per week: Not on file    Minutes per session: Not on file  . Stress: Not on file  Relationships  . Social Musician on phone: Not on file    Gets together: Not on file    Attends religious service: Not on file    Active member of club or organization: Not on file    Attends meetings of clubs or organizations: Not on file    Relationship status: Not on file  Other Topics Concern  . Not on file  Social History Narrative  .  Not on file    Allergies  Allergen Reactions  . Aspirin Nausea Only     Current Outpatient Medications:  .  bictegravir-emtricitabine-tenofovir AF (BIKTARVY) 50-200-25 MG TABS tablet, Take 1 tablet by mouth daily., Disp: 30 tablet, Rfl: 0 .  sulfamethoxazole-trimethoprim (BACTRIM) 400-80 MG tablet, Take 1 tablet by mouth daily., Disp: 30 tablet, Rfl: 0    Review of Systems  Constitutional: Negative for activity change, appetite change, chills, diaphoresis, fatigue, fever and unexpected weight change.  HENT: Negative for congestion, rhinorrhea, sinus pressure, sneezing, sore throat and trouble swallowing.   Eyes: Negative for photophobia and visual disturbance.  Respiratory: Negative for cough, chest tightness, shortness of breath, wheezing and stridor.   Cardiovascular: Positive for chest pain. Negative for palpitations and leg swelling.  Gastrointestinal: Negative for abdominal distention, abdominal pain, anal bleeding, blood in stool,  constipation, diarrhea, nausea and vomiting.  Genitourinary: Negative for difficulty urinating, dysuria, flank pain and hematuria.  Musculoskeletal: Negative for arthralgias, back pain, gait problem, joint swelling and myalgias.  Skin: Negative for color change, pallor, rash and wound.  Neurological: Negative for dizziness, tremors, weakness and light-headedness.  Hematological: Negative for adenopathy. Does not bruise/bleed easily.  Psychiatric/Behavioral: Negative for agitation, behavioral problems, confusion, decreased concentration, dysphoric mood and sleep disturbance.       Objective:   Physical Exam Constitutional:      General: He is not in acute distress.    Appearance: Normal appearance. He is well-developed. He is not ill-appearing or diaphoretic.  HENT:     Head: Normocephalic and atraumatic.     Right Ear: Hearing and external ear normal.     Left Ear: Hearing and external ear normal.     Nose: No nasal deformity or rhinorrhea.  Eyes:     General: No scleral icterus.    Extraocular Movements: Extraocular movements intact.     Conjunctiva/sclera: Conjunctivae normal.     Right eye: Right conjunctiva is not injected.     Left eye: Left conjunctiva is not injected.  Neck:     Musculoskeletal: Normal range of motion and neck supple.     Vascular: No JVD.  Cardiovascular:     Rate and Rhythm: Normal rate and regular rhythm.     Heart sounds: S1 normal and S2 normal.  Pulmonary:     Effort: Pulmonary effort is normal. No respiratory distress.     Breath sounds: No wheezing.  Abdominal:     General: There is no distension.     Palpations: Abdomen is soft.  Musculoskeletal: Normal range of motion.     Right shoulder: Normal.     Left shoulder: Normal.     Right hip: Normal.     Left hip: Normal.     Right knee: Normal.     Left knee: Normal.  Lymphadenopathy:     Head:     Right side of head: No submandibular, preauricular or posterior auricular adenopathy.      Left side of head: No submandibular, preauricular or posterior auricular adenopathy.     Cervical: No cervical adenopathy.     Right cervical: No superficial or deep cervical adenopathy.    Left cervical: No superficial or deep cervical adenopathy.  Skin:    General: Skin is warm and dry.     Coloration: Skin is not pale.     Findings: No abrasion, bruising, ecchymosis, erythema, lesion or rash.     Nails: There is no clubbing.   Neurological:     General:  No focal deficit present.     Mental Status: He is alert and oriented to person, place, and time.     Sensory: No sensory deficit.     Coordination: Coordination normal.     Gait: Gait normal.  Psychiatric:        Attention and Perception: He is attentive.        Mood and Affect: Mood normal.        Speech: Speech normal.        Behavior: Behavior normal. Behavior is cooperative.        Thought Content: Thought content normal.        Judgment: Judgment normal.           Assessment & Plan:   HIV/AIDS: Continue BIKTARVY recheck labs in one month  Bactrim DS for PCP prevention  Asthma: will write for advair and use pharma assistance from [place]  Cocaine use: denies active use today  Need for vaccines; he accepted prevnar but refused flu vaccine  We  spent greater than 60 minutes with the patient including greater than 50% of time in face to face counsel of the patient re the nature of vaccines, live and attentuated, history of their development, impact on mortality and morbidity, safety,rare adverse events, discussing different ARV regimens, discussing asthma treatment with inhaled steroids and LABA + rescue inhalers and in coordination of his care.

## 2019-09-22 LAB — QUANTIFERON-TB GOLD PLUS
Mitogen-NIL: >10 IU/mL
NIL: 0.06 IU/mL
QuantiFERON-TB Gold Plus: NEGATIVE
TB1-NIL: 0 IU/mL
TB2-NIL: 0 IU/mL

## 2019-10-03 ENCOUNTER — Telehealth: Payer: Self-pay | Admitting: Pharmacy Technician

## 2019-10-03 NOTE — Telephone Encounter (Signed)
[facility] Patient Advocate Encounter  Completed and sent [employer] application for ADVAIR for this patient who is uninsured.    Patient is approved 09/29/2019 through 09/27/2020.  The medication is shipping today from their pharmacy and will arrive to the patient in 7-10 days.  Venida Jarvis. Nadara Mustard Weaver Patient [facility] Phone: [phone] Fax:  [phone]

## 2019-10-18 ENCOUNTER — Ambulatory Visit: Payer: Self-pay | Attending: Internal Medicine

## 2019-10-18 DIAGNOSIS — U071 COVID-19: Secondary | ICD-10-CM | POA: Insufficient documentation

## 2019-10-18 DIAGNOSIS — Z20822 Contact with and (suspected) exposure to covid-19: Secondary | ICD-10-CM

## 2019-10-20 ENCOUNTER — Other Ambulatory Visit: Payer: Self-pay | Admitting: Infectious Disease

## 2019-10-20 DIAGNOSIS — B20 Human immunodeficiency virus [HIV] disease: Secondary | ICD-10-CM

## 2019-10-20 LAB — NOVEL CORONAVIRUS, NAA: SARS-CoV-2, NAA: DETECTED — AB

## 2019-10-26 ENCOUNTER — Telehealth: Payer: Self-pay

## 2019-10-26 NOTE — Telephone Encounter (Signed)
Patient called to request STI treatment. Feels like he may have been exposed. Advised patient that he will have routine testing at his upcoming visit on 10/31/19. Patient states he is currently having clear drainage from his penis.  Patient also advised he could be tested and treated at the local HD and provided patient with HD contact info.  Valarie Cones

## 2019-10-26 NOTE — Telephone Encounter (Signed)
Patient called and made aware that Dr. Daiva Eves has agreed to treated him with Ceftriaxone 500mg  injection and 1 gram azithromycin once.  Patient agrees and understands new appointment.  

## 2019-10-26 NOTE — Telephone Encounter (Signed)
Noted. Patient scheduled for treatment when he comes in for labs. Thanks

## 2019-10-26 NOTE — Telephone Encounter (Signed)
I would be happy to have him get empiric rx with Ceftriaxone 500mg  and 1 gram azithromycin when we get his labs

## 2019-10-31 ENCOUNTER — Telehealth: Payer: Self-pay

## 2019-10-31 NOTE — Telephone Encounter (Signed)
COVID-19 Pre-Screening Questions:10/31/19  Do you currently have a fever (>100 F), chills or unexplained body aches? NO   . Are you currently experiencing new cough, shortness of breath, sore throat, runny nose? NO .  Have you recently travelled outside the state of [place] in the last 14 days? NO .  Have you been in contact with someone that is currently pending confirmation of Covid19 testing or has been confirmed to have the Covid19 virus?  NO  **If the patient answers NO to ALL questions -  advise the patient to please call the clinic before coming to the office should any symptoms develop.     

## 2019-11-01 ENCOUNTER — Other Ambulatory Visit: Payer: Self-pay

## 2019-11-01 ENCOUNTER — Ambulatory Visit: Payer: Self-pay

## 2019-11-02 ENCOUNTER — Encounter: Payer: Self-pay | Admitting: Infectious Disease

## 2019-11-02 ENCOUNTER — Ambulatory Visit: Payer: Self-pay

## 2019-11-02 ENCOUNTER — Ambulatory Visit (INDEPENDENT_AMBULATORY_CARE_PROVIDER_SITE_OTHER): Payer: Self-pay | Admitting: *Deleted

## 2019-11-02 ENCOUNTER — Other Ambulatory Visit: Payer: Self-pay

## 2019-11-02 DIAGNOSIS — A64 Unspecified sexually transmitted disease: Secondary | ICD-10-CM

## 2019-11-02 DIAGNOSIS — B20 Human immunodeficiency virus [HIV] disease: Secondary | ICD-10-CM

## 2019-11-02 MED ORDER — AZITHROMYCIN 250 MG PO TABS
1000.0000 mg | ORAL_TABLET | Freq: Once | ORAL | Status: AC
  Administered 2019-11-02: 1000 mg via ORAL

## 2019-11-02 MED ORDER — CEFTRIAXONE SODIUM 500 MG IJ SOLR
500.0000 mg | Freq: Once | INTRAMUSCULAR | Status: AC
  Administered 2019-11-02: 16:00:00 500 mg via INTRAMUSCULAR

## 2019-11-02 NOTE — Addendum Note (Signed)
Addended by: Jorey Dollard D on: 11/02/2019 08:43 AM   Modules accepted: Orders  

## 2019-11-02 NOTE — Addendum Note (Signed)
Addended by: Syndey Jaskolski D on: 11/02/2019 08:43 AM   Modules accepted: Orders  

## 2019-11-02 NOTE — Addendum Note (Signed)
Addended by: Mariea Clonts D on: 11/02/2019 08:43 AM   Modules accepted: Orders

## 2019-11-02 NOTE — Progress Notes (Signed)
Per order from Dr Daiva Eves, patient treated empirically with 500mg  ceftriaxone and 1000 mg azithromycin. Patient educated to avoid sexual interaction for the next 10 days, given condoms for future interactions. Patient verbalized agreement. , RN

## 2019-11-03 LAB — URINE CYTOLOGY ANCILLARY ONLY
Chlamydia: NEGATIVE
Comment: NEGATIVE
Comment: NORMAL
Neisseria Gonorrhea: POSITIVE — AB

## 2019-11-03 LAB — T-HELPER CELL (CD4) - (RCID CLINIC ONLY)
CD4 % Helper T Cell: 21 % — ABNORMAL LOW (ref 33–65)
CD4 T Cell Abs: 306 /uL — ABNORMAL LOW (ref 400–1790)

## 2019-11-08 LAB — COMPLETE METABOLIC PANEL WITH GFR
AG Ratio: 0.9 (calc) — ABNORMAL LOW (ref 1.0–2.5)
ALT: 12 U/L (ref 9–46)
AST: 18 U/L (ref 10–40)
Albumin: 4 g/dL (ref 3.6–5.1)
Alkaline phosphatase (APISO): 68 U/L (ref 36–130)
BUN: 13 mg/dL (ref 7–25)
CO2: 27 mmol/L (ref 20–32)
Calcium: 9.3 mg/dL (ref 8.6–10.3)
Chloride: 106 mmol/L (ref 98–110)
Creat: 1.19 mg/dL (ref 0.60–1.35)
GFR, Est African American: 92 mL/min/{1.73_m2} (ref >=60)
GFR, Est Non African American: 79 mL/min/{1.73_m2} (ref >=60)
Globulin: 4.3 g/dL (calc) — ABNORMAL HIGH (ref 1.9–3.7)
Glucose, Bld: 98 mg/dL (ref 65–99)
Potassium: 4 mmol/L (ref 3.5–5.3)
Sodium: 137 mmol/L (ref 135–146)
Total Bilirubin: 0.3 mg/dL (ref 0.2–1.2)
Total Protein: 8.3 g/dL — ABNORMAL HIGH (ref 6.1–8.1)

## 2019-11-08 LAB — CBC WITH DIFFERENTIAL/PLATELET
Absolute Monocytes: 322 cells/uL (ref 200–950)
Basophils Absolute: 9 cells/uL (ref 0–200)
Basophils Relative: 0.2 %
Eosinophils Absolute: 9 cells/uL — ABNORMAL LOW (ref 15–500)
Eosinophils Relative: 0.2 %
HCT: 40.7 % (ref 38.5–50.0)
Hemoglobin: 13.7 g/dL (ref 13.2–17.1)
Lymphs Abs: 1348 cells/uL (ref 850–3900)
MCH: 28.5 pg (ref 27.0–33.0)
MCHC: 33.7 g/dL (ref 32.0–36.0)
MCV: 84.6 fL (ref 80.0–100.0)
MPV: 10.7 fL (ref 7.5–12.5)
Monocytes Relative: 7 %
Neutro Abs: 2912 cells/uL (ref 1500–7800)
Neutrophils Relative %: 63.3 %
Platelets: 223 10*3/uL (ref 140–400)
RBC: 4.81 10*6/uL (ref 4.20–5.80)
RDW: 14.6 % (ref 11.0–15.0)
Total Lymphocyte: 29.3 %
WBC: 4.6 10*3/uL (ref 3.8–10.8)

## 2019-11-08 LAB — HIV-1 RNA QUANT-NO REFLEX-BLD
HIV 1 RNA Quant: <20 copies/mL
HIV-1 RNA Quant, Log: <1.3 Log copies/mL

## 2019-11-08 LAB — HLA B*5701: HLA-B*5701 w/rflx HLA-B High: NEGATIVE

## 2019-11-08 LAB — RPR: RPR Ser Ql: NONREACTIVE

## 2019-11-28 ENCOUNTER — Encounter: Payer: Self-pay | Admitting: Infectious Disease

## 2019-12-12 ENCOUNTER — Other Ambulatory Visit: Payer: Self-pay

## 2019-12-12 ENCOUNTER — Encounter: Payer: Self-pay | Admitting: Infectious Disease

## 2019-12-12 ENCOUNTER — Ambulatory Visit (INDEPENDENT_AMBULATORY_CARE_PROVIDER_SITE_OTHER): Payer: Self-pay | Admitting: Infectious Disease

## 2019-12-12 VITALS — BP 124/82 | HR 97 | Temp 98.0°F | Ht 70.0 in | Wt 159.0 lb

## 2019-12-12 DIAGNOSIS — B20 Human immunodeficiency virus [HIV] disease: Secondary | ICD-10-CM

## 2019-12-12 DIAGNOSIS — J45909 Unspecified asthma, uncomplicated: Secondary | ICD-10-CM

## 2019-12-12 DIAGNOSIS — Z5900 Homelessness unspecified: Secondary | ICD-10-CM | POA: Insufficient documentation

## 2019-12-12 DIAGNOSIS — Z23 Encounter for immunization: Secondary | ICD-10-CM

## 2019-12-12 HISTORY — DX: Homelessness unspecified: Z59.00

## 2019-12-12 MED ORDER — SULFAMETHOXAZOLE-TRIMETHOPRIM 800-160 MG PO TABS: 1.0000 | ORAL_TABLET | ORAL | 4 refills | Status: DC

## 2019-12-12 MED ORDER — BIKTARVY 50-200-25 MG PO TABS: 1.0000 | ORAL_TABLET | Freq: Every day | ORAL | 11 refills | Status: AC

## 2019-12-12 NOTE — Progress Notes (Signed)
Subjective:  Chief complaint: He is having some difficulty relating to the fact that he is largely living at friends houses and cannot tolerate staying at his mother's house.   Patient ID: Travis Herring, male    DOB: Oct 13, 1986, 34 y.o.   MRN: [id number]  HPI  34 year old [place] American man who was previously in care in [place] and followed by me having been most recently on Complera with me. He transferred care to [place] and was on STRIBILD for some time. He was visiting his grandmother in [place] who ultimately died from heart attack.   He has been on Biktarvy and also now Bactrim to prevent PCP  He is staying with friends and sometimes with the mother but having a lot of stress living with her because he says that she "still treats me like him 34 years old".    Past Medical History:  Diagnosis Date  . Asthma   . Cocaine use 09/20/2019    Past Surgical History:  Procedure Laterality Date  . APPENDECTOMY      Family History  Problem Relation Age of Onset  . Hypertension Mother   . Hypertension Sister   . Diabetes Maternal Grandmother   . Heart disease Maternal Grandmother   . Hypertension Maternal Grandmother   . Cancer Maternal Grandfather        colon  . Aneurysm Maternal Grandfather   . Heart disease Paternal Grandmother   . Hypertension Sister       Social History   Socioeconomic History  . Marital status: Single    Spouse name: Not on file  . Number of children: Not on file  . Years of education: Not on file  . Highest education level: Not on file  Occupational History  . Not on file  Tobacco Use  . Smoking status: Current Some Day Smoker    Packs/day: 0.25    Types: Cigarettes, Cigars    Last attempt to quit: 10/13/2013    Years since quitting: 6.1  . Smokeless tobacco: Never Used  . Tobacco comment: black and milds; 3 per day  Substance and Sexual Activity  . Alcohol use: Yes    Alcohol/week: 1.0 standard drinks    Types: 1 Cans of beer per week     Comment: occassionally   . Drug use: Yes    Frequency: 7.0 times per week    Types: Marijuana    Comment: 1-3 blunts daily; cocaine occassionally   . Sexual activity: Not Currently    Partners: Female, Male    Comment: given ondoms  Other Topics Concern  . Not on file  Social History Narrative  . Not on file   Social Determinants of Health   Financial Resource Strain:   . Difficulty of Paying Living Expenses: Not on file  Food Insecurity:   . Worried About Programme researcher, broadcasting/film/video in the Last Year: Not on file  . Ran Out of Food in the Last Year: Not on file  Transportation Needs:   . Lack of Transportation (Medical): Not on file  . Lack of Transportation (Non-Medical): Not on file  Physical Activity:   . Days of Exercise per Week: Not on file  . Minutes of Exercise per Session: Not on file  Stress:   . Feeling of Stress : Not on file  Social Connections:   . Frequency of Communication with Friends and Family: Not on file  . Frequency of Social Gatherings with Friends and Family: Not on  file  . Attends Religious Services: Not on file  . Active Member of Clubs or Organizations: Not on file  . Attends Archivist Meetings: Not on file  . Marital Status: Not on file    Allergies  Allergen Reactions  . Aspirin Nausea Only     Current Outpatient Medications:  .  BIKTARVY 50-200-25 MG TABS tablet, TAKE 1 TABLET BY MOUTH DAILY, Disp: 30 tablet, Rfl: 2 .  Fluticasone-Salmeterol (ADVAIR DISKUS) 100-50 MCG/DOSE AEPB, Inhale 1 puff into the lungs 2 (two) times daily., Disp: 60 each, Rfl: 11 .  sulfamethoxazole-trimethoprim (BACTRIM DS) 800-160 MG tablet, Take 1 tablet by mouth daily., Disp: 30 tablet, Rfl: 11    Review of Systems  Constitutional: Negative for activity change, appetite change, chills, diaphoresis, fatigue, fever and unexpected weight change.  HENT: Negative for congestion, rhinorrhea, sinus pressure, sneezing, sore throat and trouble swallowing.   Eyes:  Negative for photophobia and visual disturbance.  Respiratory: Negative for cough, chest tightness, shortness of breath, wheezing and stridor.   Cardiovascular: Negative for chest pain, palpitations and leg swelling.  Gastrointestinal: Negative for abdominal distention, abdominal pain, anal bleeding, blood in stool, constipation, diarrhea, nausea and vomiting.  Genitourinary: Negative for difficulty urinating, dysuria, flank pain and hematuria.  Musculoskeletal: Negative for arthralgias, back pain, gait problem, joint swelling and myalgias.  Skin: Negative for color change, pallor, rash and wound.  Neurological: Negative for dizziness, tremors, weakness and light-headedness.  Hematological: Negative for adenopathy. Does not bruise/bleed easily.  Psychiatric/Behavioral: Positive for dysphoric mood. Negative for agitation, behavioral problems, confusion, decreased concentration, hallucinations, sleep disturbance and suicidal ideas. The patient is not hyperactive.        Objective:   Physical Exam Constitutional:      General: He is not in acute distress.    Appearance: Normal appearance. He is well-developed. He is not ill-appearing or diaphoretic.  HENT:     Head: Normocephalic and atraumatic.     Right Ear: Hearing and external ear normal.     Left Ear: Hearing and external ear normal.     Nose: No nasal deformity or rhinorrhea.  Eyes:     General: No scleral icterus.    Extraocular Movements: Extraocular movements intact.     Conjunctiva/sclera: Conjunctivae normal.     Right eye: Right conjunctiva is not injected.     Left eye: Left conjunctiva is not injected.  Neck:     Vascular: No JVD.  Cardiovascular:     Rate and Rhythm: Normal rate and regular rhythm.     Heart sounds: S1 normal and S2 normal.  Pulmonary:     Effort: Pulmonary effort is normal. No respiratory distress.     Breath sounds: No wheezing.  Abdominal:     General: There is no distension.     Palpations:  Abdomen is soft.  Musculoskeletal:        General: Normal range of motion.     Right shoulder: Normal.     Left shoulder: Normal.     Cervical back: Normal range of motion and neck supple.     Right hip: Normal.     Left hip: Normal.     Right knee: Normal.     Left knee: Normal.  Lymphadenopathy:     Head:     Right side of head: No submandibular, preauricular or posterior auricular adenopathy.     Left side of head: No submandibular, preauricular or posterior auricular adenopathy.     Cervical: No cervical  adenopathy.     Right cervical: No superficial or deep cervical adenopathy.    Left cervical: No superficial or deep cervical adenopathy.  Skin:    General: Skin is warm and dry.     Coloration: Skin is not pale.     Findings: No abrasion, bruising, ecchymosis, erythema, lesion or rash.     Nails: There is no clubbing.  Neurological:     General: No focal deficit present.     Mental Status: He is alert and oriented to person, place, and time. Mental status is at baseline.     Sensory: No sensory deficit.     Coordination: Coordination normal.     Gait: Gait normal.  Psychiatric:        Attention and Perception: He is attentive.        Mood and Affect: Mood is depressed.        Speech: Speech normal.        Behavior: Behavior normal. Behavior is cooperative.        Thought Content: Thought content normal.        Judgment: Judgment normal.           Assessment & Plan:   HIV/AIDS: Continue BIKTARVY  Bactrim DS for PCP prevention though we can change to 3 times a week if he likes  Asthma: advair and use pharma assistance from [employer]

## 2019-12-15 ENCOUNTER — Telehealth: Payer: Self-pay | Admitting: *Deleted

## 2019-12-15 NOTE — Telephone Encounter (Signed)
Referral made to counselor at [facility]. Patient would prefer to speak with a black male counselor if at all possible. Travis Herring

## 2020-03-01 ENCOUNTER — Emergency Department (HOSPITAL_COMMUNITY): Payer: Self-pay

## 2020-03-01 ENCOUNTER — Emergency Department (HOSPITAL_COMMUNITY)
Admission: EM | Admit: 2020-03-01 | Discharge: 2020-03-01 | Disposition: A | Discharge status: Home / Self Care | Payer: Self-pay | Attending: Emergency Medicine | Admitting: Emergency Medicine

## 2020-03-01 ENCOUNTER — Encounter (HOSPITAL_COMMUNITY): Payer: Self-pay | Admitting: Emergency Medicine

## 2020-03-01 DIAGNOSIS — R110 Nausea: Secondary | ICD-10-CM | POA: Insufficient documentation

## 2020-03-01 DIAGNOSIS — J45909 Unspecified asthma, uncomplicated: Secondary | ICD-10-CM | POA: Insufficient documentation

## 2020-03-01 DIAGNOSIS — Z21 Asymptomatic human immunodeficiency virus [HIV] infection status: Secondary | ICD-10-CM | POA: Insufficient documentation

## 2020-03-01 DIAGNOSIS — Z79899 Other long term (current) drug therapy: Secondary | ICD-10-CM | POA: Insufficient documentation

## 2020-03-01 DIAGNOSIS — F17210 Nicotine dependence, cigarettes, uncomplicated: Secondary | ICD-10-CM | POA: Insufficient documentation

## 2020-03-01 DIAGNOSIS — R1030 Lower abdominal pain, unspecified: Secondary | ICD-10-CM | POA: Insufficient documentation

## 2020-03-01 LAB — COMPREHENSIVE METABOLIC PANEL
ALT: 16 U/L (ref 0–44)
AST: 21 U/L (ref 15–41)
Albumin: 4.2 g/dL (ref 3.5–5.0)
Alkaline Phosphatase: 69 U/L (ref 38–126)
Anion gap: 10 (ref 5–15)
BUN: 8 mg/dL (ref 6–20)
CO2: 25 mmol/L (ref 22–32)
Calcium: 9.2 mg/dL (ref 8.9–10.3)
Chloride: 103 mmol/L (ref 98–111)
Creatinine, Ser: 0.96 mg/dL (ref 0.61–1.24)
GFR calc Af Amer: >60 mL/min (ref >60)
GFR calc non Af Amer: >60 mL/min (ref >60)
Glucose, Bld: 87 mg/dL (ref 70–99)
Potassium: 3.9 mmol/L (ref 3.5–5.1)
Sodium: 138 mmol/L (ref 135–145)
Total Bilirubin: 1.2 mg/dL (ref 0.3–1.2)
Total Protein: 8.4 g/dL — ABNORMAL HIGH (ref 6.5–8.1)

## 2020-03-01 LAB — CBC
HCT: 46.1 % (ref 39.0–52.0)
Hemoglobin: 14.8 g/dL (ref 13.0–17.0)
MCH: 28.4 pg (ref 26.0–34.0)
MCHC: 32.1 g/dL (ref 30.0–36.0)
MCV: 88.3 fL (ref 80.0–100.0)
Platelets: 239 10*3/uL (ref 150–400)
RBC: 5.22 MIL/uL (ref 4.22–5.81)
RDW: 14.1 % (ref 11.5–15.5)
WBC: 5.6 10*3/uL (ref 4.0–10.5)
nRBC: 0 % (ref 0.0–0.2)

## 2020-03-01 LAB — URINALYSIS, ROUTINE W REFLEX MICROSCOPIC
Bacteria, UA: NONE SEEN
Bilirubin Urine: NEGATIVE
Glucose, UA: NEGATIVE mg/dL
Ketones, ur: 5 mg/dL — AB
Leukocytes,Ua: NEGATIVE
Nitrite: NEGATIVE
Protein, ur: NEGATIVE mg/dL
Specific Gravity, Urine: 1.018 (ref 1.005–1.030)
pH: 5 (ref 5.0–8.0)

## 2020-03-01 LAB — LIPASE, BLOOD: Lipase: 35 U/L (ref 11–51)

## 2020-03-01 MED ORDER — SODIUM CHLORIDE 0.9 % IV BOLUS
1000.0000 mL | Freq: Once | INTRAVENOUS | Status: AC
  Administered 2020-03-01: 1000 mL via INTRAVENOUS

## 2020-03-01 MED ORDER — SODIUM CHLORIDE 0.9% FLUSH
3.0000 mL | Freq: Once | INTRAVENOUS | Status: AC
  Administered 2020-03-01: 3 mL via INTRAVENOUS

## 2020-03-01 MED ORDER — SODIUM CHLORIDE (PF) 0.9 % IJ SOLN
INTRAMUSCULAR | Status: AC
  Filled 2020-03-01: qty 50

## 2020-03-01 MED ORDER — IOHEXOL 300 MG/ML  SOLN
100.0000 mL | Freq: Once | INTRAMUSCULAR | Status: AC | PRN
  Administered 2020-03-01: 100 mL via INTRAVENOUS

## 2020-03-01 MED ORDER — ONDANSETRON 4 MG PO TBDP
4.0000 mg | ORAL_TABLET | Freq: Three times a day (TID) | ORAL | 0 refills | Status: DC | PRN
Start: 2020-03-01 — End: 2021-10-12

## 2020-03-01 MED ORDER — ONDANSETRON HCL 4 MG/2ML IJ SOLN
4.0000 mg | Freq: Once | INTRAMUSCULAR | Status: AC
  Administered 2020-03-01: 4 mg via INTRAVENOUS
  Filled 2020-03-01: qty 2

## 2020-03-01 MED ORDER — MORPHINE SULFATE (PF) 4 MG/ML IV SOLN
4.0000 mg | Freq: Once | INTRAVENOUS | Status: AC
  Administered 2020-03-01: 4 mg via INTRAVENOUS
  Filled 2020-03-01: qty 1

## 2020-03-01 NOTE — ED Provider Notes (Signed)
[name]   CSN: [id number] Arrival date & time: 03/01/20  1756    History Chief Complaint  Patient presents with  . Abdominal Pain  . Nausea    Travis Herring is a 34 y.o. male with past medical history significant for asthma, cocaine use, homelessness, HIV, compliant with medications who presents for evaluation of abdominal pain.  Patient states last night after work he developed diffuse lower abdominal pain.  Did not radiate.  No testicular pain, redness or swelling.  No hematuria or dysuria.  Had a bowel movement this morning without melena or bright blood per rectum without pain.  Has had persistent nausea without emesis.  States he is compliant with his HIV medication his Bactrim.  Denies fever, chills, headache, lightheadedness, dizziness, chest pain, shortness of breath, dysuria, hematuria, penile discharge, testicular redness, swelling, warmth.  Denies aggravating relieving factors.  He is not take anything for symptoms.  Rates pain a 5/10.  History obtained from patient and past medical records.  No interpreter is used.  HPI     Past Medical History:  Diagnosis Date  . Asthma   . Cocaine use 09/20/2019  . Homelessness 12/12/2019    Patient Active Problem List   Diagnosis Date Noted  . Homelessness 12/12/2019  . Asthma 09/15/2019  . HIV disease ([place]) 12/29/2013  . Tattoo 12/29/2013    Past Surgical History:  Procedure Laterality Date  . APPENDECTOMY         Family History  Problem Relation Age of Onset  . Hypertension Mother   . Hypertension Sister   . Diabetes Maternal Grandmother   . Heart disease Maternal Grandmother   . Hypertension Maternal Grandmother   . Cancer Maternal Grandfather        colon  . Aneurysm Maternal Grandfather   . Heart disease Paternal Grandmother   . Hypertension Sister     Social History   Tobacco Use  . Smoking status: Current Some Day Smoker    Packs/day: 0.25    Types:  Cigarettes, Cigars    Last attempt to quit: 10/13/2013    Years since quitting: 6.3  . Smokeless tobacco: Never Used  . Tobacco comment: black and milds; 3 per day  Substance Use Topics  . Alcohol use: Yes    Alcohol/week: 1.0 standard drinks    Types: 1 Cans of beer per week    Comment: occassionally   . Drug use: Yes    Frequency: 7.0 times per week    Types: Marijuana    Comment: 1-3 blunts daily; cocaine occassionally     Home Medications Prior to Admission medications   Medication Sig Start Date End Date Taking? Authorizing Provider  bictegravir-emtricitabine-tenofovir AF (BIKTARVY) 50-200-25 MG TABS tablet Take 1 tablet by mouth daily. 12/12/19  Yes Tommy Medal, Lavell Islam, MD  Fluticasone-Salmeterol (ADVAIR DISKUS) 100-50 MCG/DOSE AEPB Inhale 1 puff into the lungs 2 (two) times daily. Patient not taking: Reported on 03/01/2020 09/20/19   Tommy Medal, Lavell Islam, MD  ondansetron [facility] ODT) 4 MG disintegrating tablet Take 1 tablet (4 mg total) by mouth every 8 (eight) hours as needed for nausea or vomiting. 03/01/20   Antwaun Buth A, PA-C  sulfamethoxazole-trimethoprim (BACTRIM DS) 800-160 MG tablet Take 1 tablet by mouth 3 (three) times a week. Patient not taking: Reported on 03/01/2020 12/12/19   Tommy Medal, Lavell Islam, MD    Allergies    Aspirin  Review of Systems   Review of Systems  Constitutional: Negative.   HENT: Negative.   Respiratory: Negative.   Cardiovascular: Negative.   Gastrointestinal: Positive for abdominal pain and nausea. Negative for abdominal distention, anal bleeding, blood in stool, constipation, diarrhea, rectal pain and vomiting.  Genitourinary: Negative for decreased urine volume, discharge, dysuria, flank pain, frequency, genital sores, hematuria, penile pain, penile swelling, scrotal swelling, testicular pain and urgency.  Musculoskeletal: Negative.   Skin: Negative.   Neurological: Negative.   All other systems reviewed and are negative.   Physical  Exam Updated Vital Signs BP (!) 142/88   Pulse 90   Temp 98.6 F (37 C) (Oral)   Resp 18   SpO2 97%   Physical Exam Vitals and nursing note reviewed.  Constitutional:      General: He is not in acute distress.    Appearance: He is well-developed. He is not ill-appearing, toxic-appearing or diaphoretic.  HENT:     Head: Normocephalic and atraumatic.     Mouth/Throat:     Mouth: Mucous membranes are moist.  Eyes:     Pupils: Pupils are equal, round, and reactive to light.  Cardiovascular:     Rate and Rhythm: Normal rate and regular rhythm.     Heart sounds: Normal heart sounds.  Pulmonary:     Effort: Pulmonary effort is normal. No respiratory distress.     Breath sounds: Normal breath sounds.  Abdominal:     General: Bowel sounds are normal. There is no distension.     Palpations: Abdomen is soft.     Tenderness: There is generalized abdominal tenderness and tenderness in the right lower quadrant, suprapubic area and left lower quadrant. There is no right CVA tenderness, guarding or rebound. Negative signs include Murphy's sign, Rovsing's sign and McBurney's sign.     Hernia: No hernia is present.  Genitourinary:    Comments: Declined Musculoskeletal:        General: Normal range of motion.     Cervical back: Normal range of motion and neck supple.  Skin:    General: Skin is warm and dry.  Neurological:     Mental Status: He is alert.    ED Results / Procedures / Treatments   Labs (all labs ordered are listed, but only abnormal results are displayed) Labs Reviewed  COMPREHENSIVE METABOLIC PANEL - Abnormal; Notable for the following components:      Result Value   Total Protein 8.4 (*)    All other components within normal limits  URINALYSIS, ROUTINE W REFLEX MICROSCOPIC - Abnormal; Notable for the following components:   Hgb urine dipstick SMALL (*)    Ketones, ur 5 (*)    All other components within normal limits  LIPASE, BLOOD  CBC     EKG None  Radiology CT Abdomen Pelvis W Contrast  Result Date: 03/01/2020 CLINICAL DATA:  Abdominal pain and nausea. EXAM: CT ABDOMEN AND PELVIS WITH CONTRAST TECHNIQUE: Multidetector CT imaging of the abdomen and pelvis was performed using the standard protocol following bolus administration of intravenous contrast. CONTRAST:  OMNIPAQUE IOHEXOL 300 MG/ML  SOLN COMPARISON:  October 28, 2013 FINDINGS: Lower chest: No acute abnormality. Hepatobiliary: No focal liver abnormality is seen. No gallstones, gallbladder Kaufhold thickening, or biliary dilatation. Pancreas: Unremarkable. No pancreatic ductal dilatation or surrounding inflammatory changes. Spleen: Normal in size without focal abnormality. Adrenals/Urinary Tract: Adrenal glands are unremarkable. Kidneys are normal, without renal calculi, focal lesion, or hydronephrosis. Bladder is unremarkable. Stomach/Bowel: Stomach is within normal limits. The appendix is not identified. No evidence  of bowel Fleissner thickening, distention, or inflammatory changes. Vascular/Lymphatic: No significant vascular findings are present. No enlarged abdominal or pelvic lymph nodes. Reproductive: Prostate is unremarkable. Other: No abdominal Elks hernia or abnormality. No abdominopelvic ascites. Musculoskeletal: No acute or significant osseous findings. IMPRESSION: No acute process in the abdomen or pelvis. Electronically Signed   By: Aram Candela M.D.   On: 03/01/2020 20:24    Procedures Procedures (including critical care time)  Medications Ordered in ED Medications  sodium chloride (PF) 0.9 % injection (has no administration in time range)  sodium chloride flush (NS) 0.9 % injection 3 mL (3 mLs Intravenous Given 03/01/20 1957)  sodium chloride 0.9 % bolus 1,000 mL (0 mLs Intravenous Stopped 03/01/20 2204)  ondansetron (ZOFRAN) injection 4 mg (4 mg Intravenous Given 03/01/20 1958)  morphine 4 MG/ML injection 4 mg (4 mg Intravenous Given 03/01/20 1958)   iohexol (OMNIPAQUE) 300 MG/ML solution 100 mL (100 mLs Intravenous Contrast Given 03/01/20 2011)    ED Course  I have reviewed the triage vital signs and the nursing notes.  Pertinent labs & imaging results that were available during my care of the patient were reviewed by me and considered in my medical decision making (see chart for details).  34 year old male presents for evaluation abdominal pain and nausea.  Symptoms began yesterday evening, less than 24 hours PTA.  He has not taken anything for his symptoms.  No radiation.  Associated nausea without emesis.  Heart lungs clear.  Abdomen soft with generalized tenderness.  Declines GU exam.  Denies any concerns for STDs.  States compliant his HIV medication and Bactrim.  Labs obtained from triage.  Labs and imaging personally reviewed and interpreted; CBC without leukocytosis Lipase 35 Metabolic panel with electrolyte, renal abnormality UA negative for infection CTAP without acute abnormality  Patient reassessed.  Pain and emesis resolved.  He is tolerating p.o. intake without difficulty.  Repeat evaluation, patient does not have a surgical abdomen.  He is requesting DC home.  Discussed symptomatic management at home.  Return for any worsening symptoms.   Patient does not meet the SIRS or Sepsis criteria.  On repeat exam patient does not have a surgical abdomin and there are no peritoneal signs.  No indication of appendicitis, bowel obstruction, bowel perforation, cholecystitis, diverticulitis.    The patient has been appropriately medically screened and/or stabilized in the ED. I have low suspicion for any other emergent medical condition which would require further screening, evaluation or treatment in the ED or require inpatient management.  Patient is hemodynamically stable and in no acute distress.  Patient able to ambulate in department prior to ED.  Evaluation does not show acute pathology that would require ongoing or additional  emergent interventions while in the emergency department or further inpatient treatment.  I have discussed the diagnosis with the patient and answered all questions.  Pain is been managed while in the emergency department and patient has no further complaints prior to discharge.  Patient is comfortable with plan discussed in room and is stable for discharge at this time.  I have discussed strict return precautions for returning to the emergency department.  Patient was encouraged to follow-up with PCP/specialist refer to at discharge.     MDM Rules/Calculators/A&P                       Final Clinical Impression(s) / ED Diagnoses Final diagnoses:  Lower abdominal pain  Nausea    Rx / DC Orders  ED Discharge Orders         Ordered    ondansetron (ZOFRAN ODT) 4 MG disintegrating tablet  Every 8 hours PRN     03/01/20 2151           Terrisha Lopata A, PA-C 03/01/20 2206    Jacalyn Lefevre, MD 03/01/20 2213

## 2020-03-01 NOTE — ED Notes (Signed)
Pt given PO fluids and crackers for PO challenge as ordered.

## 2020-03-01 NOTE — ED Triage Notes (Signed)
Pt c/o abd pains with nausea since last night. Denies vomiting urinary or bowel problems.

## 2020-03-01 NOTE — Discharge Instructions (Signed)
Make sure to eat bland foods.  Take the Zofran as needed for emesis.  Return for any worsening symptoms.

## 2020-03-01 NOTE — ED Notes (Signed)
An After Visit Summary was printed and given to the patient. Discharge instructions given and no further questions at this time.  

## 2020-03-06 ENCOUNTER — Emergency Department (HOSPITAL_COMMUNITY)
Admission: EM | Admit: 2020-03-06 | Discharge: 2020-03-06 | Disposition: A | Discharge status: Home / Self Care | Payer: Self-pay | Attending: Emergency Medicine | Admitting: Emergency Medicine

## 2020-03-06 ENCOUNTER — Other Ambulatory Visit: Payer: Self-pay

## 2020-03-06 ENCOUNTER — Encounter (HOSPITAL_COMMUNITY): Payer: Self-pay | Admitting: Emergency Medicine

## 2020-03-06 DIAGNOSIS — Z21 Asymptomatic human immunodeficiency virus [HIV] infection status: Secondary | ICD-10-CM | POA: Insufficient documentation

## 2020-03-06 DIAGNOSIS — R109 Unspecified abdominal pain: Secondary | ICD-10-CM

## 2020-03-06 DIAGNOSIS — J45909 Unspecified asthma, uncomplicated: Secondary | ICD-10-CM | POA: Insufficient documentation

## 2020-03-06 DIAGNOSIS — G8929 Other chronic pain: Secondary | ICD-10-CM | POA: Insufficient documentation

## 2020-03-06 DIAGNOSIS — Z79899 Other long term (current) drug therapy: Secondary | ICD-10-CM | POA: Insufficient documentation

## 2020-03-06 DIAGNOSIS — F17210 Nicotine dependence, cigarettes, uncomplicated: Secondary | ICD-10-CM | POA: Insufficient documentation

## 2020-03-06 DIAGNOSIS — R1033 Periumbilical pain: Secondary | ICD-10-CM | POA: Insufficient documentation

## 2020-03-06 LAB — URINALYSIS, ROUTINE W REFLEX MICROSCOPIC
Bacteria, UA: NONE SEEN
Bilirubin Urine: NEGATIVE
Glucose, UA: NEGATIVE mg/dL
Hgb urine dipstick: NEGATIVE
Ketones, ur: NEGATIVE mg/dL
Nitrite: NEGATIVE
Protein, ur: NEGATIVE mg/dL
Specific Gravity, Urine: 1.023 (ref 1.005–1.030)
pH: 5 (ref 5.0–8.0)

## 2020-03-06 LAB — LIPASE, BLOOD: Lipase: 27 U/L (ref 11–51)

## 2020-03-06 LAB — COMPREHENSIVE METABOLIC PANEL
ALT: 14 U/L (ref 0–44)
AST: 18 U/L (ref 15–41)
Albumin: 3.8 g/dL (ref 3.5–5.0)
Alkaline Phosphatase: 64 U/L (ref 38–126)
Anion gap: 6 (ref 5–15)
BUN: 9 mg/dL (ref 6–20)
CO2: 28 mmol/L (ref 22–32)
Calcium: 8.8 mg/dL — ABNORMAL LOW (ref 8.9–10.3)
Chloride: 104 mmol/L (ref 98–111)
Creatinine, Ser: 1.11 mg/dL (ref 0.61–1.24)
GFR calc Af Amer: >60 mL/min (ref >60)
GFR calc non Af Amer: >60 mL/min (ref >60)
Glucose, Bld: 106 mg/dL — ABNORMAL HIGH (ref 70–99)
Potassium: 4.2 mmol/L (ref 3.5–5.1)
Sodium: 138 mmol/L (ref 135–145)
Total Bilirubin: 0.4 mg/dL (ref 0.3–1.2)
Total Protein: 7.9 g/dL (ref 6.5–8.1)

## 2020-03-06 LAB — CBC
HCT: 42.5 % (ref 39.0–52.0)
Hemoglobin: 13.6 g/dL (ref 13.0–17.0)
MCH: 28.6 pg (ref 26.0–34.0)
MCHC: 32 g/dL (ref 30.0–36.0)
MCV: 89.5 fL (ref 80.0–100.0)
Platelets: 205 10*3/uL (ref 150–400)
RBC: 4.75 MIL/uL (ref 4.22–5.81)
RDW: 14.6 % (ref 11.5–15.5)
WBC: 6.1 10*3/uL (ref 4.0–10.5)
nRBC: 0 % (ref 0.0–0.2)

## 2020-03-06 MED ORDER — DICYCLOMINE HCL 20 MG PO TABS
20.0000 mg | ORAL_TABLET | Freq: Two times a day (BID) | ORAL | 0 refills | Status: DC
Start: 2020-03-06 — End: 2023-08-30

## 2020-03-06 MED ORDER — SODIUM CHLORIDE 0.9% FLUSH: 3.0000 mL | Freq: Once | INTRAVENOUS | Status: DC

## 2020-03-06 MED ORDER — ONDANSETRON 4 MG PO TBDP
4.0000 mg | ORAL_TABLET | Freq: Once | ORAL | Status: AC
  Administered 2020-03-06: 4 mg via ORAL
  Filled 2020-03-06: qty 1

## 2020-03-06 MED ORDER — DICYCLOMINE HCL 10 MG/ML IM SOLN
20.0000 mg | Freq: Once | INTRAMUSCULAR | Status: AC
  Administered 2020-03-06: 20 mg via INTRAMUSCULAR
  Filled 2020-03-06: qty 2

## 2020-03-06 NOTE — ED Triage Notes (Signed)
Patient here from home with complaints of abd pain near belly button that started last week increased today while at work. Nausea.

## 2020-03-06 NOTE — Discharge Instructions (Signed)
Take medications along with the nausea medicine. Follow-up with your primary care provider. Return to the ER for worsening abdominal pain, vomiting, bloody stools.

## 2020-03-06 NOTE — ED Notes (Signed)
Pt given cup for UA.

## 2020-03-06 NOTE — ED Provider Notes (Signed)
[facility] DEPT Provider Note   CSN: [id number] Arrival date & time: 03/06/20  1540     History Chief Complaint  Patient presents with  . Abdominal Pain  . Nausea    Travis Herring is a 34 y.o. male with a past medical history of HIV on medications, asthma presenting to the ED with a chief complaint of abdominal pain.  Reports 1 week history of intermittent central abdominal pain.  He reports history of similar symptoms in the past, was given medication for this but has not had it filled due to financial reasons.  He denies any nausea, vomiting, diarrhea, urinary symptoms, fever, suspicious food intake or sick contacts or similar symptoms.  Prior abdominal surgeries include appendectomy.  He states that this feels similar to his prior abdominal pain flareups.  HPI     Past Medical History:  Diagnosis Date  . Asthma   . Cocaine use 09/20/2019  . Homelessness 12/12/2019    Patient Active Problem List   Diagnosis Date Noted  . Homelessness 12/12/2019  . Asthma 09/15/2019  . HIV disease ([place]) 12/29/2013  . Tattoo 12/29/2013    Past Surgical History:  Procedure Laterality Date  . APPENDECTOMY         Family History  Problem Relation Age of Onset  . Hypertension Mother   . Hypertension Sister   . Diabetes Maternal Grandmother   . Heart disease Maternal Grandmother   . Hypertension Maternal Grandmother   . Cancer Maternal Grandfather        colon  . Aneurysm Maternal Grandfather   . Heart disease Paternal Grandmother   . Hypertension Sister     Social History   Tobacco Use  . Smoking status: Current Some Day Smoker    Packs/day: 0.25    Types: Cigarettes, Cigars    Last attempt to quit: 10/13/2013    Years since quitting: 6.4  . Smokeless tobacco: Never Used  . Tobacco comment: black and milds; 3 per day  Substance Use Topics  . Alcohol use: Yes    Alcohol/week: 1.0 standard drinks    Types: 1 Cans of beer per week    Comment:  occassionally   . Drug use: Yes    Frequency: 7.0 times per week    Types: Marijuana    Comment: 1-3 blunts daily; cocaine occassionally     Home Medications Prior to Admission medications   Medication Sig Start Date End Date Taking? Authorizing Provider  bictegravir-emtricitabine-tenofovir AF (BIKTARVY) 50-200-25 MG TABS tablet Take 1 tablet by mouth daily. 12/12/19  Yes Tommy Medal, Lavell Islam, MD  Fluticasone-Salmeterol (ADVAIR DISKUS) 100-50 MCG/DOSE AEPB Inhale 1 puff into the lungs 2 (two) times daily. 09/20/19  Yes Tommy Medal, Lavell Islam, MD  dicyclomine (BENTYL) 20 MG tablet Take 1 tablet (20 mg total) by mouth 2 (two) times daily. 03/06/20   Micca Matura, PA-C  ondansetron (ZOFRAN ODT) 4 MG disintegrating tablet Take 1 tablet (4 mg total) by mouth every 8 (eight) hours as needed for nausea or vomiting. Patient not taking: Reported on 03/06/2020 03/01/20   Henderly, Britni A, PA-C  sulfamethoxazole-trimethoprim (BACTRIM DS) 800-160 MG tablet Take 1 tablet by mouth 3 (three) times a week. Patient not taking: Reported on 03/01/2020 12/12/19   Tommy Medal, Lavell Islam, MD    Allergies    Aspirin  Review of Systems   Review of Systems  Constitutional: Negative for appetite change, chills and fever.  HENT: Negative for ear pain, rhinorrhea, sneezing  and sore throat.   Eyes: Negative for photophobia and visual disturbance.  Respiratory: Negative for cough, chest tightness, shortness of breath and wheezing.   Cardiovascular: Negative for chest pain and palpitations.  Gastrointestinal: Positive for abdominal pain. Negative for blood in stool, constipation, diarrhea, nausea and vomiting.  Genitourinary: Negative for dysuria, hematuria and urgency.  Musculoskeletal: Negative for myalgias.  Skin: Negative for rash.  Neurological: Negative for dizziness, weakness and light-headedness.    Physical Exam Updated Vital Signs BP 126/85   Pulse 70   Temp 98.9 F (37.2 C) (Oral)   Resp 16   SpO2 100%     Physical Exam Vitals and nursing note reviewed.  Constitutional:      General: He is not in acute distress.    Appearance: He is well-developed.  HENT:     Head: Normocephalic and atraumatic.     Nose: Nose normal.  Eyes:     General: No scleral icterus.       Left eye: No discharge.     Conjunctiva/sclera: Conjunctivae normal.  Cardiovascular:     Rate and Rhythm: Normal rate and regular rhythm.     Heart sounds: Normal heart sounds. No murmur. No friction rub. No gallop.   Pulmonary:     Effort: Pulmonary effort is normal. No respiratory distress.     Breath sounds: Normal breath sounds.  Abdominal:     General: Bowel sounds are normal. There is no distension.     Palpations: Abdomen is soft.     Tenderness: There is abdominal tenderness in the periumbilical area. There is no guarding.  Musculoskeletal:        General: Normal range of motion.     Cervical back: Normal range of motion and neck supple.  Skin:    General: Skin is warm and dry.     Findings: No rash.  Neurological:     Mental Status: He is alert.     Motor: No abnormal muscle tone.     Coordination: Coordination normal.     ED Results / Procedures / Treatments   Labs (all labs ordered are listed, but only abnormal results are displayed) Labs Reviewed  COMPREHENSIVE METABOLIC PANEL - Abnormal; Notable for the following components:      Result Value   Glucose, Bld 106 (*)    Calcium 8.8 (*)    All other components within normal limits  URINALYSIS, ROUTINE W REFLEX MICROSCOPIC - Abnormal; Notable for the following components:   Leukocytes,Ua TRACE (*)    All other components within normal limits  LIPASE, BLOOD  CBC    EKG None  Radiology No results found.  Procedures Procedures (including critical care time)  Medications Ordered in ED Medications  dicyclomine (BENTYL) injection 20 mg (20 mg Intramuscular Given 03/06/20 1913)  ondansetron (ZOFRAN-ODT) disintegrating tablet 4 mg (4 mg Oral  Given 03/06/20 1911)    ED Course  I have reviewed the triage vital signs and the nursing notes.  Pertinent labs & imaging results that were available during my care of the patient were reviewed by me and considered in my medical decision making (see chart for details).    MDM Rules/Calculators/A&P                      34 year old male with a past medical history of HIV on antiretrovirals, asthma presenting to the ED with chief complaint of abdominal pain.  Reports 1 week history of intermittent central abdominal pain.  Seen  and evaluated here 5 days ago for similar symptoms with reassuring work-up including CT of the abdomen pelvis without any acute findings.  He states that he did not get the medication filled that he was prescribed due to financial issues.  Denies any diarrhea, nausea, vomiting or urinary symptoms.  Abdomen is tender in the periumbilical area without rebound or guarding.  He is afebrile.  No CVA tenderness noted.  F a candidate for unremarkable CMP, CBC, lipase and urinalysis.  CT scan reviewed from 5 days ago showing no acute findings.  Patient was given Bentyl and Zofran here with relief of his symptoms.  Repeat abdominal exams are benign.  He is requesting discharge and states that he will get his medications from the pharmacy.  At this time I doubt any emergent intra-abdominal cause of his symptoms.  We will have him follow-up with PCP and return for worsening symptoms.  All imaging, if done today, including plain films, CT scans, and ultrasounds, independently reviewed by me, and interpretations confirmed via formal radiology reads.  Patient is hemodynamically stable, in NAD, and able to ambulate in the ED. Evaluation does not show pathology that would require ongoing emergent intervention or inpatient treatment. I explained the diagnosis to the patient. Pain has been managed and has no complaints prior to discharge. Patient is comfortable with above plan and is stable for  discharge at this time. All questions were answered prior to disposition. Strict return precautions for returning to the ED were discussed. Encouraged follow up with PCP.   An After Visit Summary was printed and given to the patient.   Portions of this note were generated with Scientist, clinical (histocompatibility and immunogenetics). Dictation errors may occur despite best attempts at proofreading.  Final Clinical Impression(s) / ED Diagnoses Final diagnoses:  Chronic abdominal pain    Rx / DC Orders ED Discharge Orders         Ordered    dicyclomine (BENTYL) 20 MG tablet  2 times daily     03/06/20 2019           Dietrich Pates, PA-C 03/06/20 2027    Tilden Fossa, MD 03/08/20 [phone]

## 2020-05-07 ENCOUNTER — Other Ambulatory Visit: Payer: Self-pay

## 2020-05-12 ENCOUNTER — Other Ambulatory Visit: Payer: Self-pay

## 2020-05-12 DIAGNOSIS — F17290 Nicotine dependence, other tobacco product, uncomplicated: Secondary | ICD-10-CM | POA: Insufficient documentation

## 2020-05-12 DIAGNOSIS — R509 Fever, unspecified: Secondary | ICD-10-CM | POA: Diagnosis present

## 2020-05-12 DIAGNOSIS — Z7951 Long term (current) use of inhaled steroids: Secondary | ICD-10-CM | POA: Diagnosis not present

## 2020-05-12 DIAGNOSIS — F17210 Nicotine dependence, cigarettes, uncomplicated: Secondary | ICD-10-CM | POA: Insufficient documentation

## 2020-05-12 DIAGNOSIS — Z590 Homelessness: Secondary | ICD-10-CM | POA: Diagnosis not present

## 2020-05-12 DIAGNOSIS — L0231 Cutaneous abscess of buttock: Secondary | ICD-10-CM | POA: Insufficient documentation

## 2020-05-12 DIAGNOSIS — Z79899 Other long term (current) drug therapy: Secondary | ICD-10-CM | POA: Diagnosis not present

## 2020-05-12 DIAGNOSIS — U071 COVID-19: Secondary | ICD-10-CM | POA: Diagnosis not present

## 2020-05-12 DIAGNOSIS — J45909 Unspecified asthma, uncomplicated: Secondary | ICD-10-CM | POA: Insufficient documentation

## 2020-05-13 ENCOUNTER — Other Ambulatory Visit: Payer: Self-pay

## 2020-05-13 ENCOUNTER — Encounter (HOSPITAL_COMMUNITY): Payer: Self-pay | Admitting: Emergency Medicine

## 2020-05-13 ENCOUNTER — Emergency Department (HOSPITAL_COMMUNITY): Payer: HRSA Program

## 2020-05-13 ENCOUNTER — Telehealth (HOSPITAL_COMMUNITY): Payer: Self-pay | Admitting: Nurse Practitioner

## 2020-05-13 ENCOUNTER — Emergency Department (HOSPITAL_COMMUNITY)
Admission: EM | Admit: 2020-05-13 | Discharge: 2020-05-13 | Disposition: A | Discharge status: Home / Self Care | Payer: HRSA Program | Attending: Emergency Medicine | Admitting: Emergency Medicine

## 2020-05-13 DIAGNOSIS — L0231 Cutaneous abscess of buttock: Secondary | ICD-10-CM

## 2020-05-13 DIAGNOSIS — U071 COVID-19: Secondary | ICD-10-CM

## 2020-05-13 LAB — CBC WITH DIFFERENTIAL/PLATELET
Abs Immature Granulocytes: 0.03 10*3/uL (ref 0.00–0.07)
Basophils Absolute: 0 10*3/uL (ref 0.0–0.1)
Basophils Relative: 1 %
Eosinophils Absolute: 0 10*3/uL (ref 0.0–0.5)
Eosinophils Relative: 0 %
HCT: 48.4 % (ref 39.0–52.0)
Hemoglobin: 15.9 g/dL (ref 13.0–17.0)
Immature Granulocytes: 0 %
Lymphocytes Relative: 22 %
Lymphs Abs: 1.8 10*3/uL (ref 0.7–4.0)
MCH: 29.1 pg (ref 26.0–34.0)
MCHC: 32.9 g/dL (ref 30.0–36.0)
MCV: 88.5 fL (ref 80.0–100.0)
Monocytes Absolute: 0.6 10*3/uL (ref 0.1–1.0)
Monocytes Relative: 8 %
Neutro Abs: 5.5 10*3/uL (ref 1.7–7.7)
Neutrophils Relative %: 69 %
Platelets: 198 10*3/uL (ref 150–400)
RBC: 5.47 MIL/uL (ref 4.22–5.81)
RDW: 13.3 % (ref 11.5–15.5)
WBC: 8 10*3/uL (ref 4.0–10.5)
nRBC: 0 % (ref 0.0–0.2)

## 2020-05-13 LAB — URINALYSIS, ROUTINE W REFLEX MICROSCOPIC
Bacteria, UA: NONE SEEN
Bilirubin Urine: NEGATIVE
Glucose, UA: NEGATIVE mg/dL
Ketones, ur: NEGATIVE mg/dL
Leukocytes,Ua: NEGATIVE
Nitrite: NEGATIVE
Protein, ur: NEGATIVE mg/dL
Specific Gravity, Urine: 1.027 (ref 1.005–1.030)
pH: 5 (ref 5.0–8.0)

## 2020-05-13 LAB — RAPID URINE DRUG SCREEN, HOSP PERFORMED
Amphetamines: POSITIVE — AB
Barbiturates: NOT DETECTED
Benzodiazepines: NOT DETECTED
Cocaine: POSITIVE — AB
Opiates: NOT DETECTED
Tetrahydrocannabinol: POSITIVE — AB

## 2020-05-13 LAB — COMPREHENSIVE METABOLIC PANEL
ALT: 13 U/L (ref 0–44)
AST: 17 U/L (ref 15–41)
Albumin: 3.9 g/dL (ref 3.5–5.0)
Alkaline Phosphatase: 71 U/L (ref 38–126)
Anion gap: 12 (ref 5–15)
BUN: 10 mg/dL (ref 6–20)
CO2: 25 mmol/L (ref 22–32)
Calcium: 9.2 mg/dL (ref 8.9–10.3)
Chloride: 102 mmol/L (ref 98–111)
Creatinine, Ser: 1.07 mg/dL (ref 0.61–1.24)
GFR calc Af Amer: >60 mL/min (ref >60)
GFR calc non Af Amer: >60 mL/min (ref >60)
Glucose, Bld: 105 mg/dL — ABNORMAL HIGH (ref 70–99)
Potassium: 3.8 mmol/L (ref 3.5–5.1)
Sodium: 139 mmol/L (ref 135–145)
Total Bilirubin: 0.3 mg/dL (ref 0.3–1.2)
Total Protein: 8.6 g/dL — ABNORMAL HIGH (ref 6.5–8.1)

## 2020-05-13 LAB — LACTIC ACID, PLASMA: Lactic Acid, Venous: 0.8 mmol/L (ref 0.5–1.9)

## 2020-05-13 LAB — SARS CORONAVIRUS 2 BY RT PCR (HOSPITAL ORDER, PERFORMED IN ~~LOC~~ HOSPITAL LAB): SARS Coronavirus 2: POSITIVE — AB

## 2020-05-13 MED ORDER — KETOROLAC TROMETHAMINE 30 MG/ML IJ SOLN
30.0000 mg | Freq: Once | INTRAMUSCULAR | Status: AC
  Administered 2020-05-13: 30 mg via INTRAMUSCULAR
  Filled 2020-05-13: qty 1

## 2020-05-13 MED ORDER — PREDNISONE 20 MG PO TABS
ORAL_TABLET | ORAL | 0 refills | Status: DC
Start: 2020-05-13 — End: 2021-10-12

## 2020-05-13 MED ORDER — AZITHROMYCIN 250 MG PO TABS
ORAL_TABLET | ORAL | 0 refills | Status: DC
Start: 2020-05-13 — End: 2021-10-12

## 2020-05-13 MED ORDER — ONDANSETRON 8 MG PO TBDP
8.0000 mg | ORAL_TABLET | Freq: Once | ORAL | Status: AC
  Administered 2020-05-13: 8 mg via ORAL
  Filled 2020-05-13: qty 1

## 2020-05-13 MED ORDER — ACETAMINOPHEN 325 MG PO TABS
650.0000 mg | ORAL_TABLET | Freq: Once | ORAL | Status: AC
  Administered 2020-05-13: 650 mg via ORAL
  Filled 2020-05-13: qty 2

## 2020-05-13 MED ORDER — LIDOCAINE-EPINEPHRINE 2 %-1:100000 IJ SOLN
20.0000 mL | Freq: Once | INTRAMUSCULAR | Status: AC
  Administered 2020-05-13: 20 mL
  Filled 2020-05-13: qty 1

## 2020-05-13 MED ORDER — SODIUM CHLORIDE 0.9% FLUSH: 3.0000 mL | Freq: Once | INTRAVENOUS | Status: DC

## 2020-05-13 MED ORDER — SULFAMETHOXAZOLE-TRIMETHOPRIM 800-160 MG PO TABS
1.0000 | ORAL_TABLET | Freq: Two times a day (BID) | ORAL | 0 refills | Status: DC
Start: 2020-05-13 — End: 2023-08-30

## 2020-05-13 NOTE — ED Provider Notes (Signed)
Thank you for doing a [name]-EMERGENCY DEPT Provider Note   CSN: [id number] Arrival date & time: 05/12/20  2155   Time seen 4:00 AM  History Chief Complaint  Patient presents with  . Nausea  . Generalized Body Aches    Travis Herring is a 34 y.o. male.  HPI Patient states 2 days ago he started having fever undocumented, night sweats, nausea without vomiting, chest heaviness and some wheezing, he states he has a history of asthma.  He denies cough but has had mild rhinorrhea, he denies sore throat or diarrhea.  He states he has had nausea and is vomited about 4 times.  He states he has a boil on his buttock, not his scrotum that he is never had before.  He denies any urinary symptoms.  He has not been running but he also sick.  Patient states he has not used cocaine in 2 months.  PCP Daiva EvesVan Dam, Lisette Grinderornelius N, MD     Past Medical History:  Diagnosis Date  . Asthma   . Cocaine use 09/20/2019  . Homelessness 12/12/2019    Patient Active Problem List   Diagnosis Date Noted  . Homelessness 12/12/2019  . Asthma 09/15/2019  . HIV disease (HCC) 12/29/2013  . Tattoo 12/29/2013    Past Surgical History:  Procedure Laterality Date  . APPENDECTOMY         Family History  Problem Relation Age of Onset  . Hypertension Mother   . Hypertension Sister   . Diabetes Maternal Grandmother   . Heart disease Maternal Grandmother   . Hypertension Maternal Grandmother   . Cancer Maternal Grandfather        colon  . Aneurysm Maternal Grandfather   . Heart disease Paternal Grandmother   . Hypertension Sister     Social History   Tobacco Use  . Smoking status: Current Some Day Smoker    Packs/day: 0.25    Types: Cigarettes, Cigars    Last attempt to quit: 10/13/2013    Years since quitting: 6.5  . Smokeless tobacco: Never Used  . Tobacco comment: black and milds; 3 per day  Substance Use Topics  . Alcohol use: Yes    Alcohol/week: 1.0 standard drink    Types:  1 Cans of beer per week    Comment: occassionally   . Drug use: Yes    Frequency: 7.0 times per week    Types: Marijuana    Comment: 1-3 blunts daily; cocaine occassionally     Home Medications Prior to Admission medications   Medication Sig Start Date End Date Taking? Authorizing Provider  azithromycin (ZITHROMAX) 250 MG tablet Take 2 po the first day then once a day for the next 4 days. 05/13/20   Devoria AlbeKnapp, Masa Lubin, MD  bictegravir-emtricitabine-tenofovir AF (BIKTARVY) 50-200-25 MG TABS tablet Take 1 tablet by mouth daily. 12/12/19   Randall HissVan Dam, Cornelius N, MD  dicyclomine (BENTYL) 20 MG tablet Take 1 tablet (20 mg total) by mouth 2 (two) times daily. 03/06/20   Khatri, Hina, PA-C  Fluticasone-Salmeterol (ADVAIR DISKUS) 100-50 MCG/DOSE AEPB Inhale 1 puff into the lungs 2 (two) times daily. 09/20/19   Randall HissVan Dam, Cornelius N, MD  ondansetron (ZOFRAN ODT) 4 MG disintegrating tablet Take 1 tablet (4 mg total) by mouth every 8 (eight) hours as needed for nausea or vomiting. Patient not taking: Reported on 03/06/2020 03/01/20   Henderly, Britni A, PA-C  predniSONE (DELTASONE) 20 MG tablet Take 3 po QD x 3d , then  2 po QD x 3d then 1 po QD x 3d 05/13/20   Devoria Albe, MD  sulfamethoxazole-trimethoprim (BACTRIM DS) 800-160 MG tablet Take 1 tablet by mouth 2 (two) times daily. 05/13/20   Devoria Albe, MD    Allergies    Aspirin  Review of Systems   Review of Systems  All other systems reviewed and are negative.   Physical Exam Updated Vital Signs BP (!) 137/83 (BP Location: Right Arm)   Pulse 76   Temp 98.3 F (36.8 C) (Oral)   Resp 16   Ht 5\' 10"  (1.778 m)   Wt 69.9 kg   SpO2 99%   BMI 22.10 kg/m   Physical Exam Vitals and nursing note reviewed.  Constitutional:      General: He is not in acute distress.    Appearance: Normal appearance. He is normal weight.  HENT:     Head: Normocephalic and atraumatic.     Right Ear: External ear normal.     Left Ear: External ear normal.     Nose: Nose normal.       Mouth/Throat:     Mouth: Mucous membranes are moist.     Pharynx: No oropharyngeal exudate or posterior oropharyngeal erythema.  Eyes:     Extraocular Movements: Extraocular movements intact.     Conjunctiva/sclera: Conjunctivae normal.     Pupils: Pupils are equal, round, and reactive to light.  Cardiovascular:     Rate and Rhythm: Normal rate and regular rhythm.     Pulses: Normal pulses.     Heart sounds: Normal heart sounds.  Pulmonary:     Effort: Pulmonary effort is normal. No respiratory distress.     Breath sounds: Normal breath sounds. No stridor. No wheezing, rhonchi or rales.  Abdominal:     General: Abdomen is flat. Bowel sounds are normal.     Palpations: Abdomen is soft.  Musculoskeletal:        General: Normal range of motion.     Cervical back: Normal range of motion and neck supple. No rigidity.  Skin:    General: Skin is warm and dry.     Comments: Patient has a small 2 to 3 cm area of induration on the medial aspect of his right buttock consistent with an abscess.  It does not appear to extend down to the rectum.  Neurological:     General: No focal deficit present.     Mental Status: He is alert and oriented to person, place, and time.     Cranial Nerves: No cranial nerve deficit.  Psychiatric:        Mood and Affect: Mood normal.        Behavior: Behavior normal.        Thought Content: Thought content normal.     ED Results / Procedures / Treatments   Labs (all labs ordered are listed, but only abnormal results are displayed) Results for orders placed or performed during the hospital encounter of 05/13/20  SARS Coronavirus 2 by RT PCR (hospital order, performed in [facility] hospital lab) Nasopharyngeal Nasopharyngeal Swab   Specimen: Nasopharyngeal Swab  Result Value Ref Range   SARS Coronavirus 2 POSITIVE (A) NEGATIVE  Lactic acid, plasma  Result Value Ref Range   Lactic Acid, Venous 0.8 0.5 - 1.9 mmol/L  Comprehensive metabolic panel   Result Value Ref Range   Sodium 139 135 - 145 mmol/L   Potassium 3.8 3.5 - 5.1 mmol/L   Chloride 102 98 - 111  mmol/L   CO2 25 22 - 32 mmol/L   Glucose, Bld 105 (H) 70 - 99 mg/dL   BUN 10 6 - 20 mg/dL   Creatinine, Ser 2.67 0.61 - 1.24 mg/dL   Calcium 9.2 8.9 - 12.4 mg/dL   Total Protein 8.6 (H) 6.5 - 8.1 g/dL   Albumin 3.9 3.5 - 5.0 g/dL   AST 17 15 - 41 U/L   ALT 13 0 - 44 U/L   Alkaline Phosphatase 71 38 - 126 U/L   Total Bilirubin 0.3 0.3 - 1.2 mg/dL   GFR calc non Af Amer >60 >60 mL/min   GFR calc Af Amer >60 >60 mL/min   Anion gap 12 5 - 15  CBC with Differential  Result Value Ref Range   WBC 8.0 4.0 - 10.5 K/uL   RBC 5.47 4.22 - 5.81 MIL/uL   Hemoglobin 15.9 13.0 - 17.0 g/dL   HCT 58.0 39 - 52 %   MCV 88.5 80.0 - 100.0 fL   MCH 29.1 26.0 - 34.0 pg   MCHC 32.9 30.0 - 36.0 g/dL   RDW 99.8 33.8 - 25.0 %   Platelets 198 150 - 400 K/uL   nRBC 0.0 0.0 - 0.2 %   Neutrophils Relative % 69 %   Neutro Abs 5.5 1.7 - 7.7 K/uL   Lymphocytes Relative 22 %   Lymphs Abs 1.8 0.7 - 4.0 K/uL   Monocytes Relative 8 %   Monocytes Absolute 0.6 0 - 1 K/uL   Eosinophils Relative 0 %   Eosinophils Absolute 0.0 0 - 0 K/uL   Basophils Relative 1 %   Basophils Absolute 0.0 0 - 0 K/uL   Immature Granulocytes 0 %   Abs Immature Granulocytes 0.03 0.00 - 0.07 K/uL  Urinalysis, Routine w reflex microscopic  Result Value Ref Range   Color, Urine AMBER (A) YELLOW   APPearance CLEAR CLEAR   Specific Gravity, Urine 1.027 1.005 - 1.030   pH 5.0 5.0 - 8.0   Glucose, UA NEGATIVE NEGATIVE mg/dL   Hgb urine dipstick SMALL (A) NEGATIVE   Bilirubin Urine NEGATIVE NEGATIVE   Ketones, ur NEGATIVE NEGATIVE mg/dL   Protein, ur NEGATIVE NEGATIVE mg/dL   Nitrite NEGATIVE NEGATIVE   Leukocytes,Ua NEGATIVE NEGATIVE   RBC / HPF 21-50 0 - 5 RBC/hpf   WBC, UA 0-5 0 - 5 WBC/hpf   Bacteria, UA NONE SEEN NONE SEEN   Squamous Epithelial / LPF 0-5 0 - 5   Mucus PRESENT   Rapid urine drug screen (hospital  performed)  Result Value Ref Range   Opiates NONE DETECTED NONE DETECTED   Cocaine POSITIVE (A) NONE DETECTED   Benzodiazepines NONE DETECTED NONE DETECTED   Amphetamines POSITIVE (A) NONE DETECTED   Tetrahydrocannabinol POSITIVE (A) NONE DETECTED   Barbiturates NONE DETECTED NONE DETECTED   Laboratory interpretation all normal except positive urine drug screen, hematuria without UTI    EKG None  Radiology DG Chest 2 View  Result Date: 05/13/2020 CLINICAL DATA:  Infection protocol fever EXAM: CHEST - 2 VIEW COMPARISON:  None. FINDINGS: The heart size and mediastinal contours are within normal limits. Both lungs are clear. The visualized skeletal structures are unremarkable. IMPRESSION: No active cardiopulmonary disease. Electronically Signed   By: Jonna Clark M.D.   On: 05/13/2020 00:38    Procedures Procedures (including critical care time)  Medications Ordered in ED Medications  acetaminophen (TYLENOL) tablet 650 mg (650 mg Oral Given 05/13/20 0207)  ketorolac (TORADOL) 30 MG/ML  injection 30 mg (30 mg Intramuscular Given 05/13/20 0631)  ondansetron (ZOFRAN-ODT) disintegrating tablet 8 mg (8 mg Oral Given 05/13/20 0631)  lidocaine-EPINEPHrine (XYLOCAINE W/EPI) 2 %-1:100000 (with pres) injection 20 mL (20 mLs Infiltration Given 05/13/20 0631)    ED Course  I have reviewed the triage vital signs and the nursing notes.  Pertinent labs & imaging results that were available during my care of the patient were reviewed by me and considered in my medical decision making (see chart for details).    MDM Rules/Calculators/A&P                          Patient is Covid positive however his chest x-ray is normal and his pulse ox has been 99 to 100% on room air.  I have sent a message to get him scheduled for monoclonal antibody infusion.  Patient's abscess was drained by PA.  He was given instructions for how to care for himself with the Covid.  ADVAITH LAMARQUE was evaluated in Emergency  Department on 05/13/2020 for the symptoms described in the history of present illness. He was evaluated in the context of the global COVID-19 pandemic, which necessitated consideration that the patient might be at risk for infection with the SARS-CoV-2 virus that causes COVID-19. Institutional protocols and algorithms that pertain to the evaluation of patients at risk for COVID-19 are in a state of rapid change based on information released by regulatory bodies including the CDC and federal and state organizations. These policies and algorithms were followed during the patient's care in the ED.     Final Clinical Impression(s) / ED Diagnoses Final diagnoses:  COVID-19  Abscess of right buttock    Rx / DC Orders ED Discharge Orders         Ordered    azithromycin (ZITHROMAX) 250 MG tablet     Discontinue  Reprint     05/13/20 0718    predniSONE (DELTASONE) 20 MG tablet     Discontinue  Reprint     05/13/20 0718    sulfamethoxazole-trimethoprim (BACTRIM DS) 800-160 MG tablet  2 times daily     Discontinue  Reprint     05/13/20 0720          Plan discharge  Devoria Albe, MD, Concha Pyo, MD 05/13/20 [phone]

## 2020-05-13 NOTE — ED Provider Notes (Signed)
..  Incision and Drainage  Date/Time: 05/13/2020 7:26 AM Performed by: Maxwell Caul, PA-C Authorized by: Maxwell Caul, PA-C   Consent:    Consent obtained:  Verbal   Consent given by:  Patient   Risks discussed:  Bleeding, incomplete drainage, pain and damage to other organs   Alternatives discussed:  No treatment Universal protocol:    Procedure explained and questions answered to patient or proxy's satisfaction: yes     Relevant documents present and verified: yes     Test results available and properly labeled: yes     Imaging studies available: yes     Required blood products, implants, devices, and special equipment available: yes     Site/side marked: yes     Immediately prior to procedure a time out was called: yes     Patient identity confirmed:  Verbally with patient Location:    Type:  Abscess   Location:  Anogenital   Anogenital location:  Pilonidal Pre-procedure details:    Skin preparation:  Betadine Anesthesia (see MAR for exact dosages):    Anesthesia method:  Local infiltration   Local anesthetic:  Lidocaine 2% WITH epi Procedure type:    Complexity:  Complex Procedure details:    Incision types:  Single straight   Incision depth:  Subcutaneous   Scalpel blade:  11   Wound management:  Probed and deloculated, irrigated with saline and extensive cleaning   Drainage:  Purulent   Drainage amount:  Moderate   Packing materials:  1/4 in gauze Post-procedure details:    Patient tolerance of procedure:  Tolerated well, no immediate complications Comments:     Once area was anesthetized, sterilely extensively cleaned with Betadine solution.  A small incision was made into the more distal aspect about 2 cm away from the anus.  There was no drainage.  This incision did not involve the anus.  Once there was no drainage, I went more proximal and made another incision which showed purulent drainage.  The area was thoroughly extensively irrigated with sterile saline  and a packing was placed to help with continued healing.      Maxwell Caul, PA-C 05/13/20 [id number]    Devoria Albe, MD 05/13/20 [phone]

## 2020-05-13 NOTE — Discharge Instructions (Addendum)
You will be contacted about getting the monoclonal antibody infusion for the Covid. Drink plenty of fluids.  Take ibuprofen 400 mg plus acetaminophen 650 mg every 6 hours as needed for fever or pain.  For your Covid, you need to get zinc 50 mg and take 1 daily.  Take 1 aspirin 81 mg daily.  You also need to take vitamin D and vitamin C.  If you start getting a cough or chest pain, get the Z-Pak and prednisone filled.  If you can get a oxygen monitor that would be great, if your oxygen gets below 90% you should return to the emergency department.  You should also return to the emergency department if you struggle to breathe and or get chest pain.  The packing in the boil should be pulled out in 2 days, you can do that while in the shower or in the bathtub.

## 2020-05-13 NOTE — ED Triage Notes (Signed)
Patient is complaining of generalized pain. night sweats, boil on sacrum, chills, and nausea. Patient states this started two days ago. Patient wants a covid test.

## 2020-05-13 NOTE — Telephone Encounter (Signed)
Called to Discuss with patient about Covid symptoms and the use of regeneron, a monoclonal antibody infusion for those with mild to moderate Covid symptoms and at a high risk of hospitalization.     Pt is qualified for this infusion at the [facility] infusion center due to co-morbid conditions and/or a member of an at-risk group.     Unable to reach pt. Left message to return call & sent mychart message.   Consuello Masse, DNP, AGNP-C [phone] (Infusion Center Hotline)

## 2020-05-13 NOTE — ED Notes (Signed)
Save blue in main lab 

## 2020-05-13 NOTE — ED Notes (Signed)
Date and time results received: 05/13/20 0532 (use smartphrase ".now" to insert current time)  Test: COVID Critical Value: Positive  Name of Provider Notified: Dr.I Lynelle Doctor  Orders Received? Or Actions Taken?:

## 2020-05-21 ENCOUNTER — Encounter: Payer: Self-pay | Admitting: Infectious Disease

## 2020-07-14 ENCOUNTER — Encounter (HOSPITAL_COMMUNITY): Payer: Self-pay

## 2020-07-14 ENCOUNTER — Emergency Department (HOSPITAL_COMMUNITY)
Admission: EM | Admit: 2020-07-14 | Discharge: 2020-07-14 | Disposition: A | Discharge status: Home / Self Care | Payer: Self-pay | Attending: Emergency Medicine | Admitting: Emergency Medicine

## 2020-07-14 ENCOUNTER — Other Ambulatory Visit: Payer: Self-pay

## 2020-07-14 DIAGNOSIS — J069 Acute upper respiratory infection, unspecified: Secondary | ICD-10-CM | POA: Insufficient documentation

## 2020-07-14 DIAGNOSIS — F17210 Nicotine dependence, cigarettes, uncomplicated: Secondary | ICD-10-CM | POA: Insufficient documentation

## 2020-07-14 DIAGNOSIS — Z7951 Long term (current) use of inhaled steroids: Secondary | ICD-10-CM | POA: Insufficient documentation

## 2020-07-14 DIAGNOSIS — H9202 Otalgia, left ear: Secondary | ICD-10-CM | POA: Insufficient documentation

## 2020-07-14 DIAGNOSIS — J45909 Unspecified asthma, uncomplicated: Secondary | ICD-10-CM | POA: Insufficient documentation

## 2020-07-14 DIAGNOSIS — F17290 Nicotine dependence, other tobacco product, uncomplicated: Secondary | ICD-10-CM | POA: Insufficient documentation

## 2020-07-14 NOTE — ED Provider Notes (Signed)
[facility] EMERGENCY DEPARTMENT Provider Note   CSN: [id number] Arrival date & time: 07/14/20  1502     History No chief complaint on file.   Travis Herring is a 34 y.o. male.  HPI 34 year old male with history of asthma, cocaine use, homelessness, HIV presents to the ER with complaints of sore throat, nasal congestion, left ear pain x3 days.  Also endorses a productive nonbloody cough.  Denies any trauma, chest pain, shortness of breath.  No hearing changes.  Denies any voice changes: Body swallow, drooling.  States he has been compliant with his HIV medications.  He is not vaccinated for Covid, but has had Covid twice, most recently being in August of this year.  Denies any dysuria, headache, dizziness, loss of taste or smell.    Past Medical History:  Diagnosis Date   Asthma    Cocaine use 09/20/2019   Homelessness 12/12/2019    Patient Active Problem List   Diagnosis Date Noted   Homelessness 12/12/2019   Asthma 09/15/2019   HIV disease (HCC) 12/29/2013   Tattoo 12/29/2013    Past Surgical History:  Procedure Laterality Date   APPENDECTOMY         Family History  Problem Relation Age of Onset   Hypertension Mother    Hypertension Sister    Diabetes Maternal Grandmother    Heart disease Maternal Grandmother    Hypertension Maternal Grandmother    Cancer Maternal Grandfather        colon   Aneurysm Maternal Grandfather    Heart disease Paternal Grandmother    Hypertension Sister     Social History   Tobacco Use   Smoking status: Current Some Day Smoker    Packs/day: 0.25    Types: Cigarettes, Cigars    Last attempt to quit: 10/13/2013    Years since quitting: 6.7   Smokeless tobacco: Never Used   Tobacco comment: black and milds; 3 per day  Substance Use Topics   Alcohol use: Yes    Alcohol/week: 1.0 standard drink    Types: 1 Cans of beer per week    Comment: occassionally    Drug use: Yes    Frequency: 7.0  times per week    Types: Marijuana    Comment: 1-3 blunts daily; cocaine occassionally     Home Medications Prior to Admission medications   Medication Sig Start Date End Date Taking? Authorizing Provider  azithromycin (ZITHROMAX) 250 MG tablet Take 2 po the first day then once a day for the next 4 days. 05/13/20   Devoria Albe, MD  bictegravir-emtricitabine-tenofovir AF (BIKTARVY) 50-200-25 MG TABS tablet Take 1 tablet by mouth daily. 12/12/19   Randall Hiss, MD  dicyclomine (BENTYL) 20 MG tablet Take 1 tablet (20 mg total) by mouth 2 (two) times daily. 03/06/20   Khatri, Hina, PA-C  Fluticasone-Salmeterol (ADVAIR DISKUS) 100-50 MCG/DOSE AEPB Inhale 1 puff into the lungs 2 (two) times daily. 09/20/19   Randall Hiss, MD  ondansetron (ZOFRAN ODT) 4 MG disintegrating tablet Take 1 tablet (4 mg total) by mouth every 8 (eight) hours as needed for nausea or vomiting. Patient not taking: Reported on 03/06/2020 03/01/20   Henderly, Britni A, PA-C  predniSONE (DELTASONE) 20 MG tablet Take 3 po QD x 3d , then 2 po QD x 3d then 1 po QD x 3d 05/13/20   Devoria Albe, MD  sulfamethoxazole-trimethoprim (BACTRIM DS) 800-160 MG tablet Take 1 tablet by mouth 2 (two) times  daily. 05/13/20   Devoria Albe, MD    Allergies    Aspirin  Review of Systems   Review of Systems  Constitutional: Negative for chills and fever.  HENT: Positive for congestion, ear pain and sore throat. Negative for ear discharge, trouble swallowing and voice change.   Eyes: Negative for pain and visual disturbance.  Respiratory: Positive for cough. Negative for shortness of breath.   Cardiovascular: Negative for chest pain and palpitations.  Gastrointestinal: Negative for abdominal pain and vomiting.  Genitourinary: Negative for dysuria and hematuria.  Musculoskeletal: Negative for arthralgias and back pain.  Skin: Negative for color change and rash.  Neurological: Negative for seizures and syncope.  All other systems reviewed and  are negative.   Physical Exam Updated Vital Signs BP 120/80 (BP Location: Left Arm)    Pulse (!) 112    Temp 98.9 F (37.2 C) (Oral)    Resp 17    SpO2 99%   Physical Exam Vitals and nursing note reviewed.  Constitutional:      General: He is not in acute distress.    Appearance: He is well-developed. He is not ill-appearing, toxic-appearing or diaphoretic.  HENT:     Head: Normocephalic and atraumatic.     Ears:     Comments: Left TM w/ bulging, no significant erythema, evidence of fluid behind the ER. TM intact. No tragal tenderness    Mouth/Throat:     Mouth: Mucous membranes are moist.     Pharynx: Oropharynx is clear. No oropharyngeal exudate or posterior oropharyngeal erythema.     Comments: Oropharynx non erythematous without exudates, uvula midline, no unilateral tonsillar swelling, tongue normal size and midline, no sublingual/submandibular swellimg, tolerating secretions well    Eyes:     Conjunctiva/sclera: Conjunctivae normal.  Cardiovascular:     Rate and Rhythm: Normal rate and regular rhythm.     Pulses: Normal pulses.     Heart sounds: Normal heart sounds. No murmur heard.   Pulmonary:     Effort: Pulmonary effort is normal. No respiratory distress.     Breath sounds: Normal breath sounds.  Abdominal:     General: Abdomen is flat.     Palpations: Abdomen is soft.     Tenderness: There is no abdominal tenderness.  Musculoskeletal:        General: Normal range of motion.     Cervical back: Normal range of motion and neck supple.  Skin:    General: Skin is warm and dry.     Findings: No erythema.  Neurological:     Mental Status: He is alert.     ED Results / Procedures / Treatments   Labs (all labs ordered are listed, but only abnormal results are displayed) Labs Reviewed - No data to display  EKG None  Radiology No results found.  Procedures Procedures (including critical care time)  Medications Ordered in ED Medications - No data to  display  ED Course  I have reviewed the triage vital signs and the nursing notes.  Pertinent labs & imaging results that were available during my care of the patient were reviewed by me and considered in my medical decision making (see chart for details).    MDM Rules/Calculators/A&P                          Patient presents  productive cough, left ear pain.  No evidence of acute otitis media as erythema is minimal on left ear  exam, with some TM bulging.  No evidence of peritonsillar abscess, retropharyngeal abscess, Ludwig's angina on throat exam.  Lung sounds clear.  Patient states he has been compliant with his HIV medications.  He did have Covid in August and retesting currently is not indicated as he is within the 32-month window.  Patients symptoms are consistent with URI, likely viral etiology. Discussed that antibiotics are not indicated for viral infections. Pt will be discharged with symptomatic treatment.  Verbalizes understanding and is agreeable with plan. Pt is hemodynamically stable & in NAD prior to dc.  Final Clinical Impression(s) / ED Diagnoses Final diagnoses:  Viral URI  Left ear pain    Rx / DC Orders ED Discharge Orders    None       Mare Ferrari, PA-C 07/14/20 1911    Virgina Norfolk, DO 07/14/20 2108

## 2020-07-14 NOTE — Discharge Instructions (Signed)

## 2020-07-14 NOTE — ED Triage Notes (Signed)
Patient complains of left ear pain, dry throat, congestion x 3 days. Congestion noted on assessment. No fever, no headache, NAD

## 2020-07-20 ENCOUNTER — Emergency Department (HOSPITAL_COMMUNITY): Payer: Self-pay

## 2020-07-20 ENCOUNTER — Emergency Department (HOSPITAL_COMMUNITY)
Admission: EM | Admit: 2020-07-20 | Discharge: 2020-07-20 | Disposition: A | Discharge status: Home / Self Care | Payer: Self-pay | Attending: Emergency Medicine | Admitting: Emergency Medicine

## 2020-07-20 ENCOUNTER — Other Ambulatory Visit: Payer: Self-pay

## 2020-07-20 ENCOUNTER — Encounter (HOSPITAL_COMMUNITY): Payer: Self-pay | Admitting: Emergency Medicine

## 2020-07-20 DIAGNOSIS — F17290 Nicotine dependence, other tobacco product, uncomplicated: Secondary | ICD-10-CM | POA: Insufficient documentation

## 2020-07-20 DIAGNOSIS — F17210 Nicotine dependence, cigarettes, uncomplicated: Secondary | ICD-10-CM | POA: Insufficient documentation

## 2020-07-20 DIAGNOSIS — R079 Chest pain, unspecified: Secondary | ICD-10-CM | POA: Insufficient documentation

## 2020-07-20 DIAGNOSIS — R109 Unspecified abdominal pain: Secondary | ICD-10-CM | POA: Insufficient documentation

## 2020-07-20 DIAGNOSIS — Z7951 Long term (current) use of inhaled steroids: Secondary | ICD-10-CM | POA: Insufficient documentation

## 2020-07-20 DIAGNOSIS — R0602 Shortness of breath: Secondary | ICD-10-CM | POA: Insufficient documentation

## 2020-07-20 DIAGNOSIS — J45909 Unspecified asthma, uncomplicated: Secondary | ICD-10-CM | POA: Insufficient documentation

## 2020-07-20 LAB — CBC
HCT: 39.6 % (ref 39.0–52.0)
Hemoglobin: 12.6 g/dL — ABNORMAL LOW (ref 13.0–17.0)
MCH: 26.3 pg (ref 26.0–34.0)
MCHC: 31.8 g/dL (ref 30.0–36.0)
MCV: 82.5 fL (ref 80.0–100.0)
Platelets: 303 10*3/uL (ref 150–400)
RBC: 4.8 MIL/uL (ref 4.22–5.81)
RDW: 14 % (ref 11.5–15.5)
WBC: 6.7 10*3/uL (ref 4.0–10.5)
nRBC: 0 % (ref 0.0–0.2)

## 2020-07-20 LAB — BASIC METABOLIC PANEL
Anion gap: 9 (ref 5–15)
BUN: 9 mg/dL (ref 6–20)
CO2: 24 mmol/L (ref 22–32)
Calcium: 8.6 mg/dL — ABNORMAL LOW (ref 8.9–10.3)
Chloride: 101 mmol/L (ref 98–111)
Creatinine, Ser: 0.97 mg/dL (ref 0.61–1.24)
GFR, Estimated: >60 mL/min (ref >60)
Glucose, Bld: 140 mg/dL — ABNORMAL HIGH (ref 70–99)
Potassium: 3.5 mmol/L (ref 3.5–5.1)
Sodium: 134 mmol/L — ABNORMAL LOW (ref 135–145)

## 2020-07-20 LAB — HEPATIC FUNCTION PANEL
ALT: 12 U/L (ref 0–44)
AST: 18 U/L (ref 15–41)
Albumin: 2.9 g/dL — ABNORMAL LOW (ref 3.5–5.0)
Alkaline Phosphatase: 74 U/L (ref 38–126)
Bilirubin, Direct: 0.1 mg/dL (ref 0.0–0.2)
Indirect Bilirubin: 0.4 mg/dL (ref 0.3–0.9)
Total Bilirubin: 0.5 mg/dL (ref 0.3–1.2)
Total Protein: 9.3 g/dL — ABNORMAL HIGH (ref 6.5–8.1)

## 2020-07-20 LAB — TROPONIN I (HIGH SENSITIVITY)
Troponin I (High Sensitivity): 4 ng/L (ref <18)
Troponin I (High Sensitivity): 3 ng/L (ref <18)

## 2020-07-20 LAB — LIPASE, BLOOD: Lipase: 93 U/L — ABNORMAL HIGH (ref 11–51)

## 2020-07-20 MED ORDER — ALUM & MAG HYDROXIDE-SIMETH 200-200-20 MG/5ML PO SUSP
30.0000 mL | Freq: Once | ORAL | Status: AC
  Administered 2020-07-20: 30 mL via ORAL
  Filled 2020-07-20: qty 30

## 2020-07-20 MED ORDER — LIDOCAINE VISCOUS HCL 2 % MT SOLN
15.0000 mL | Freq: Once | OROMUCOSAL | Status: AC
  Administered 2020-07-20: 15 mL via ORAL
  Filled 2020-07-20: qty 15

## 2020-07-20 MED ORDER — DICYCLOMINE HCL 10 MG/5ML PO SOLN
10.0000 mg | Freq: Once | ORAL | Status: AC
  Administered 2020-07-20: 10 mg via ORAL
  Filled 2020-07-20 (×2): qty 5

## 2020-07-20 NOTE — Discharge Instructions (Signed)
Try pepcid or tagamet up to twice a day.  Try to avoid things that may make this worse, most commonly these are spicy foods tomato based products fatty foods chocolate and peppermint.  Alcohol and tobacco can also make this worse.  Return to the emergency department for sudden worsening pain fever or inability to eat or drink.  

## 2020-07-20 NOTE — ED Triage Notes (Signed)
Pt c/o cp, sob, abd pain for the past few days states he was seen before for same and sent home. Pt is HIV pt not taking his medication.

## 2020-07-20 NOTE — ED Provider Notes (Signed)
[facility] EMERGENCY DEPARTMENT Provider Note   CSN: [id number] Arrival date & time: 07/20/20  1413     History Chief Complaint  Patient presents with  . Chest Pain    Travis Herring is a 34 y.o. male.  34 yo M with chief complaints of left chest pain and abdominal pain.  Going on for about a week now.  Chest pain is palpable worse on the right than the left.  Also is having diffuse abdominal pain.  Denies nausea vomiting or diarrhea.  Denies fevers.  Denies trauma.  Has had a mild cough off and on.  Feels like that is gotten a bit better.  The history is provided by the patient.  Chest Pain Pain location:  L chest and R chest Pain quality: aching   Pain radiates to:  Does not radiate Pain severity:  Moderate Onset quality:  Gradual Duration:  1 week Timing:  Constant Progression:  Worsening Chronicity:  New Associated symptoms: abdominal pain and shortness of breath   Associated symptoms: no fever, no headache, no palpitations and no vomiting        Past Medical History:  Diagnosis Date  . Asthma   . Cocaine use 09/20/2019  . Homelessness 12/12/2019    Patient Active Problem List   Diagnosis Date Noted  . Homelessness 12/12/2019  . Asthma 09/15/2019  . HIV disease (HCC) 12/29/2013  . Tattoo 12/29/2013    Past Surgical History:  Procedure Laterality Date  . APPENDECTOMY         Family History  Problem Relation Age of Onset  . Hypertension Mother   . Hypertension Sister   . Diabetes Maternal Grandmother   . Heart disease Maternal Grandmother   . Hypertension Maternal Grandmother   . Cancer Maternal Grandfather        colon  . Aneurysm Maternal Grandfather   . Heart disease Paternal Grandmother   . Hypertension Sister     Social History   Tobacco Use  . Smoking status: Current Some Day Smoker    Packs/day: 0.25    Types: Cigarettes, Cigars    Last attempt to quit: 10/13/2013    Years since quitting: 6.7  . Smokeless tobacco:  Never Used  . Tobacco comment: black and milds; 3 per day  Substance Use Topics  . Alcohol use: Yes    Alcohol/week: 1.0 standard drink    Types: 1 Cans of beer per week    Comment: occassionally   . Drug use: Yes    Frequency: 7.0 times per week    Types: Marijuana    Comment: 1-3 blunts daily; cocaine occassionally     Home Medications Prior to Admission medications   Medication Sig Start Date End Date Taking? Authorizing Provider  azithromycin (ZITHROMAX) 250 MG tablet Take 2 po the first day then once a day for the next 4 days. 05/13/20   Devoria Albe, MD  bictegravir-emtricitabine-tenofovir AF (BIKTARVY) 50-200-25 MG TABS tablet Take 1 tablet by mouth daily. 12/12/19   Randall Hiss, MD  dicyclomine (BENTYL) 20 MG tablet Take 1 tablet (20 mg total) by mouth 2 (two) times daily. 03/06/20   Khatri, Hina, PA-C  Fluticasone-Salmeterol (ADVAIR DISKUS) 100-50 MCG/DOSE AEPB Inhale 1 puff into the lungs 2 (two) times daily. 09/20/19   Randall Hiss, MD  ondansetron (ZOFRAN ODT) 4 MG disintegrating tablet Take 1 tablet (4 mg total) by mouth every 8 (eight) hours as needed for nausea or vomiting. Patient not  taking: Reported on 03/06/2020 03/01/20   Henderly, Britni A, PA-C  predniSONE (DELTASONE) 20 MG tablet Take 3 po QD x 3d , then 2 po QD x 3d then 1 po QD x 3d 05/13/20   Devoria Albe, MD  sulfamethoxazole-trimethoprim (BACTRIM DS) 800-160 MG tablet Take 1 tablet by mouth 2 (two) times daily. 05/13/20   Devoria Albe, MD    Allergies    Aspirin  Review of Systems   Review of Systems  Constitutional: Negative for chills and fever.  HENT: Negative for congestion and facial swelling.   Eyes: Negative for discharge and visual disturbance.  Respiratory: Positive for shortness of breath.   Cardiovascular: Positive for chest pain. Negative for palpitations.  Gastrointestinal: Positive for abdominal pain. Negative for diarrhea and vomiting.  Musculoskeletal: Negative for arthralgias and  myalgias.  Skin: Negative for color change and rash.  Neurological: Negative for tremors, syncope and headaches.  Psychiatric/Behavioral: Negative for confusion and dysphoric mood.    Physical Exam Updated Vital Signs BP (!) 130/93 (BP Location: Left Arm)   Pulse 93   Temp 98.6 F (37 C) (Oral)   Resp 16   Ht 5\' 10"  (1.778 m)   Wt 69.9 kg   SpO2 100%   BMI 22.11 kg/m   Physical Exam Vitals and nursing note reviewed.  Constitutional:      Appearance: He is well-developed.  HENT:     Head: Normocephalic and atraumatic.  Eyes:     Pupils: Pupils are equal, round, and reactive to light.  Neck:     Vascular: No JVD.  Cardiovascular:     Rate and Rhythm: Normal rate and regular rhythm.     Heart sounds: No murmur heard.  No friction rub. No gallop.   Pulmonary:     Effort: No respiratory distress.     Breath sounds: No wheezing.  Chest:     Chest [place]: Tenderness present.     Comments: Diffuse pain across the chest reproduces the patients symptoms Abdominal:     General: There is no distension.     Tenderness: There is no guarding or rebound.     Comments: Mild diffuse abdominal pain  Musculoskeletal:        General: Normal range of motion.     Cervical back: Normal range of motion and neck supple.  Skin:    Coloration: Skin is not pale.     Findings: No rash.  Neurological:     Mental Status: He is alert and oriented to person, place, and time.  Psychiatric:        Behavior: Behavior normal.     ED Results / Procedures / Treatments   Labs (all labs ordered are listed, but only abnormal results are displayed) Labs Reviewed  BASIC METABOLIC PANEL - Abnormal; Notable for the following components:      Result Value   Sodium 134 (*)    Glucose, Bld 140 (*)    Calcium 8.6 (*)    All other components within normal limits  CBC - Abnormal; Notable for the following components:   Hemoglobin 12.6 (*)    All other components within normal limits  HEPATIC FUNCTION  PANEL - Abnormal; Notable for the following components:   Total Protein 9.3 (*)    Albumin 2.9 (*)    All other components within normal limits  LIPASE, BLOOD - Abnormal; Notable for the following components:   Lipase 93 (*)    All other components within normal limits  TROPONIN  I (HIGH SENSITIVITY)  TROPONIN I (HIGH SENSITIVITY)    EKG EKG Interpretation  Date/Time:  Friday July 20 2020 14:21:52 EDT Ventricular Rate:  103 PR Interval:  116 QRS Duration: 76 QT Interval:  324 QTC Calculation: 424 R Axis:   98 Text Interpretation: Sinus tachycardia Right atrial enlargement Rightward axis Biventricular hypertrophy T wave abnormality, consider inferolateral ischemia Abnormal ECG No significant change since last tracing Confirmed by Melene PlanFloyd, Sherman Donaldson [phone]([id number]) on 07/20/2020 8:20:58 PM   Radiology DG Chest 1 View  Result Date: 07/20/2020 CLINICAL DATA:  Chest pain, shortness of breath and abdominal pain for several days EXAM: CHEST  1 VIEW COMPARISON:  Radiograph 05/13/2020 FINDINGS: No consolidation, features of edema, pneumothorax, or effusion. Pulmonary vascularity is normally distributed. The cardiomediastinal contours are unremarkable. No acute osseous or soft tissue abnormality. IMPRESSION: No acute cardiopulmonary abnormality. Electronically Signed   By: Kreg ShropshirePrice  DeHay M.D.   On: 07/20/2020 21:53    Procedures Procedures (including critical care time)  Medications Ordered in ED Medications  alum & mag hydroxide-simeth (MAALOX/MYLANTA) 200-200-20 MG/5ML suspension 30 mL (has no administration in time range)    And  lidocaine (XYLOCAINE) 2 % viscous mouth solution 15 mL (has no administration in time range)  dicyclomine (BENTYL) 10 MG/5ML solution 10 mg (has no administration in time range)    ED Course  I have reviewed the triage vital signs and the nursing notes.  Pertinent labs & imaging results that were available during my care of the patient were reviewed by me and considered  in my medical decision making (see chart for details).    MDM Rules/Calculators/A&P                          34 yo M with a chief complaints of chest pain and abdominal pain.  This been going on for about a week.  Chest pain is now worse than the abdominal pain but start with abdominal pain.  Actually had a cough last week that is gotten somewhat better.  Pain is somewhat worse with coughing.  Chest pain is atypical in nature and reproduced on exam.  He had a delta troponin that was negative.  His lipase is mildly elevated at 93.  There is no LFT elevation.  Seems less likely to be pancreatitis.  I think more likely the patient has muscular strain after a week of coughing.  Will treat supportively.  PCP follow-up.  11:08 PM:  I have discussed the diagnosis/risks/treatment options with the patient and believe the pt to be eligible for discharge home to follow-up with PCP. We also discussed returning to the ED immediately if new or worsening sx occur. We discussed the sx which are most concerning (e.g., sudden worsening pain, fever, inability to tolerate by mouth) that necessitate immediate return. Medications administered to the patient during their visit and any new prescriptions provided to the patient are listed below.  Medications given during this visit Medications  alum & mag hydroxide-simeth (MAALOX/MYLANTA) 200-200-20 MG/5ML suspension 30 mL (has no administration in time range)    And  lidocaine (XYLOCAINE) 2 % viscous mouth solution 15 mL (has no administration in time range)  dicyclomine (BENTYL) 10 MG/5ML solution 10 mg (has no administration in time range)     The patient appears reasonably screen and/or stabilized for discharge and I doubt any other medical condition or other [facility] requiring further screening, evaluation, or treatment in the ED at this time prior to discharge.  Final Clinical Impression(s) / ED Diagnoses Final diagnoses:  Nonspecific chest pain    Rx / DC  Orders ED Discharge Orders    None       Melene Plan, DO 07/20/20 2308

## 2020-07-20 NOTE — ED Notes (Signed)
Pt stepped outside.  

## 2020-07-24 ENCOUNTER — Telehealth: Payer: Self-pay

## 2020-07-24 NOTE — Telephone Encounter (Signed)
Patient called office today to schedule follow up apppintment for recent ED visit. States that chest pain is getting worse some days, and would like to be seen tomorrow.  Informed patient that MD is not in clinic for the next two weeks and should visit his local ED regarding worsening pain. Patient states he does not want to go back to ED. Encouraged patient to visit ED regarding concern since they would be able to handle this better then outpatient. Patient states that he is also having penile discharge that started today. Denies having unprotected sex within the last month. Advised he visit [facility] Department for STD testing. Patient would prefer to come into office. Patient also mentions during call that he has been off medication for two months. When asked why he didn't contact office or pharmacy; states that he did not have contact information. Would like to know if he could get refills sent in today. Before ending call CMA encouraged patient to visit ED regarding chest pain. Advised he establish primary care to follow up on concern. Advised to contact health department for STD testing. Will forward message to MD per patient request to advise on "emergency appointment" for tomorrow and refills. Travis Herring, [place]

## 2020-07-25 ENCOUNTER — Other Ambulatory Visit: Payer: Self-pay

## 2020-07-25 ENCOUNTER — Emergency Department (HOSPITAL_COMMUNITY): Payer: Self-pay

## 2020-07-25 ENCOUNTER — Emergency Department (HOSPITAL_COMMUNITY)
Admission: EM | Admit: 2020-07-25 | Discharge: 2020-07-25 | Disposition: A | Discharge status: Home / Self Care | Payer: Self-pay | Attending: Emergency Medicine | Admitting: Emergency Medicine

## 2020-07-25 ENCOUNTER — Encounter (HOSPITAL_COMMUNITY): Payer: Self-pay | Admitting: Emergency Medicine

## 2020-07-25 DIAGNOSIS — N342 Other urethritis: Secondary | ICD-10-CM

## 2020-07-25 DIAGNOSIS — N341 Nonspecific urethritis: Secondary | ICD-10-CM | POA: Insufficient documentation

## 2020-07-25 DIAGNOSIS — F17210 Nicotine dependence, cigarettes, uncomplicated: Secondary | ICD-10-CM | POA: Insufficient documentation

## 2020-07-25 DIAGNOSIS — R1013 Epigastric pain: Secondary | ICD-10-CM | POA: Insufficient documentation

## 2020-07-25 DIAGNOSIS — J45909 Unspecified asthma, uncomplicated: Secondary | ICD-10-CM | POA: Insufficient documentation

## 2020-07-25 DIAGNOSIS — R079 Chest pain, unspecified: Secondary | ICD-10-CM | POA: Insufficient documentation

## 2020-07-25 DIAGNOSIS — R0789 Other chest pain: Secondary | ICD-10-CM

## 2020-07-25 LAB — COMPREHENSIVE METABOLIC PANEL
ALT: 15 U/L (ref 0–44)
AST: 20 U/L (ref 15–41)
Albumin: 3.2 g/dL — ABNORMAL LOW (ref 3.5–5.0)
Alkaline Phosphatase: 72 U/L (ref 38–126)
Anion gap: 9 (ref 5–15)
BUN: 11 mg/dL (ref 6–20)
CO2: 26 mmol/L (ref 22–32)
Calcium: 9.2 mg/dL (ref 8.9–10.3)
Chloride: 101 mmol/L (ref 98–111)
Creatinine, Ser: 0.99 mg/dL (ref 0.61–1.24)
GFR, Estimated: >60 mL/min (ref >60)
Glucose, Bld: 109 mg/dL — ABNORMAL HIGH (ref 70–99)
Potassium: 4 mmol/L (ref 3.5–5.1)
Sodium: 136 mmol/L (ref 135–145)
Total Bilirubin: 0.8 mg/dL (ref 0.3–1.2)
Total Protein: 9.3 g/dL — ABNORMAL HIGH (ref 6.5–8.1)

## 2020-07-25 LAB — CBC
HCT: 40.5 % (ref 39.0–52.0)
Hemoglobin: 13.5 g/dL (ref 13.0–17.0)
MCH: 26.9 pg (ref 26.0–34.0)
MCHC: 33.3 g/dL (ref 30.0–36.0)
MCV: 80.8 fL (ref 80.0–100.0)
Platelets: 295 10*3/uL (ref 150–400)
RBC: 5.01 MIL/uL (ref 4.22–5.81)
RDW: 14.3 % (ref 11.5–15.5)
WBC: 6.6 10*3/uL (ref 4.0–10.5)
nRBC: 0 % (ref 0.0–0.2)

## 2020-07-25 LAB — URINALYSIS, ROUTINE W REFLEX MICROSCOPIC
Glucose, UA: NEGATIVE mg/dL
Hgb urine dipstick: NEGATIVE
Ketones, ur: 5 mg/dL — AB
Nitrite: NEGATIVE
Protein, ur: 30 mg/dL — AB
Specific Gravity, Urine: 1.03 (ref 1.005–1.030)
WBC, UA: >50 WBC/hpf — ABNORMAL HIGH (ref 0–5)
pH: 6 (ref 5.0–8.0)

## 2020-07-25 LAB — LIPASE, BLOOD: Lipase: 80 U/L — ABNORMAL HIGH (ref 11–51)

## 2020-07-25 LAB — TROPONIN I (HIGH SENSITIVITY): Troponin I (High Sensitivity): 2 ng/L (ref <18)

## 2020-07-25 MED ORDER — SODIUM CHLORIDE 0.9 % IV SOLN
1.0000 g | Freq: Once | INTRAVENOUS | Status: AC
  Administered 2020-07-25: 1 g via INTRAVENOUS
  Filled 2020-07-25: qty 10

## 2020-07-25 MED ORDER — MORPHINE SULFATE (PF) 4 MG/ML IV SOLN
4.0000 mg | Freq: Once | INTRAVENOUS | Status: AC
  Administered 2020-07-25: 4 mg via INTRAVENOUS
  Filled 2020-07-25: qty 1

## 2020-07-25 MED ORDER — DOXYCYCLINE HYCLATE 100 MG PO CAPS: 100.0000 mg | ORAL_CAPSULE | Freq: Two times a day (BID) | ORAL | 0 refills | Status: DC

## 2020-07-25 MED ORDER — HYDROMORPHONE HCL 1 MG/ML IJ SOLN
1.0000 mg | Freq: Once | INTRAMUSCULAR | Status: AC
  Administered 2020-07-25: 1 mg via INTRAVENOUS
  Filled 2020-07-25: qty 1

## 2020-07-25 MED ORDER — SODIUM CHLORIDE 0.9 % IV BOLUS
1000.0000 mL | Freq: Once | INTRAVENOUS | Status: AC
  Administered 2020-07-25: 1000 mL via INTRAVENOUS

## 2020-07-25 MED ORDER — SODIUM CHLORIDE (PF) 0.9 % IJ SOLN
INTRAMUSCULAR | Status: AC
  Filled 2020-07-25: qty 50

## 2020-07-25 MED ORDER — ONDANSETRON HCL 4 MG/2ML IJ SOLN
4.0000 mg | Freq: Once | INTRAMUSCULAR | Status: AC
  Administered 2020-07-25: 4 mg via INTRAVENOUS
  Filled 2020-07-25: qty 2

## 2020-07-25 MED ORDER — IOHEXOL 300 MG/ML  SOLN
100.0000 mL | Freq: Once | INTRAMUSCULAR | Status: AC | PRN
  Administered 2020-07-25: 100 mL via INTRAVENOUS

## 2020-07-25 MED ORDER — CEPHALEXIN 500 MG PO CAPS: 500.0000 mg | ORAL_CAPSULE | Freq: Two times a day (BID) | ORAL | 0 refills | Status: DC

## 2020-07-25 NOTE — ED Notes (Signed)
Assumed care of patient at this time, nad noted, sr up x2, bed locked and low, call bell w/I reach.  Will continue to monitor. ° °

## 2020-07-25 NOTE — Telephone Encounter (Signed)
He can see another provider if anyone avaiilable but we may send him to Er for admission depending on what is going on. He needs sti treatment which we can do and to restart his meds

## 2020-07-25 NOTE — ED Notes (Signed)
Patient transported to CT 

## 2020-07-25 NOTE — Telephone Encounter (Signed)
Patient seen at ED today. Per note patient treated for Gonorrhea/ Chlamydia. Will offer appointment to do lab work this week and follow up two weeks after.  Lorenso Courier, [place]

## 2020-07-25 NOTE — ED Provider Notes (Signed)
[facility]-EMERGENCY DEPT Provider Note   CSN: [id number] Arrival date & time: 07/25/20  0034     History Chief Complaint  Patient presents with  . Penile Discharge  . Chest Pain  . Abdominal Pain    Travis Herring is a 34 y.o. male.  Patient presents to the emergency department for evaluation of multiple problems.  Patient reports that he has been having intermittent chest pain for the last 2 weeks.  He has not identified exacerbating or alleviating factors.  He has also been experiencing pain across his upper abdomen.  He has had some associated nausea but no vomiting.  He has felt mild constipation.  Pain is mostly in the center of the abdomen but does move around at times.  Patient also reports that he has a urethral discharge that he first noticed this morning.  Reports a "milky" discharge.  He has had STDs in the past with discharge but usually he gets burning with urination which he does not currently have.  Patient is HIV positive and has been off of his medications.  He reports that when he noted that he was running out he started taking his meds every other day but now is completely out.        Past Medical History:  Diagnosis Date  . Asthma   . Cocaine use 09/20/2019  . Homelessness 12/12/2019    Patient Active Problem List   Diagnosis Date Noted  . Homelessness 12/12/2019  . Asthma 09/15/2019  . HIV disease (HCC) 12/29/2013  . Tattoo 12/29/2013    Past Surgical History:  Procedure Laterality Date  . APPENDECTOMY         Family History  Problem Relation Age of Onset  . Hypertension Mother   . Hypertension Sister   . Diabetes Maternal Grandmother   . Heart disease Maternal Grandmother   . Hypertension Maternal Grandmother   . Cancer Maternal Grandfather        colon  . Aneurysm Maternal Grandfather   . Heart disease Paternal Grandmother   . Hypertension Sister     Social History   Tobacco Use  . Smoking status: Current Some  Day Smoker    Packs/day: 0.25    Types: Cigarettes, Cigars    Last attempt to quit: 10/13/2013    Years since quitting: 6.7  . Smokeless tobacco: Never Used  . Tobacco comment: black and milds; 3 per day  Substance Use Topics  . Alcohol use: Yes    Alcohol/week: 1.0 standard drink    Types: 1 Cans of beer per week    Comment: occassionally   . Drug use: Yes    Frequency: 7.0 times per week    Types: Marijuana    Comment: 1-3 blunts daily; cocaine occassionally     Home Medications Prior to Admission medications   Medication Sig Start Date End Date Taking? Authorizing Provider  bictegravir-emtricitabine-tenofovir AF (BIKTARVY) 50-200-25 MG TABS tablet Take 1 tablet by mouth daily. 12/12/19  Yes Daiva Eves, Lisette Grinder, MD  Fluticasone-Salmeterol (ADVAIR DISKUS) 100-50 MCG/DOSE AEPB Inhale 1 puff into the lungs 2 (two) times daily. Patient taking differently: Inhale 1 puff into the lungs daily as needed (sob).  09/20/19  Yes Daiva Eves, Lisette Grinder, MD  azithromycin (ZITHROMAX) 250 MG tablet Take 2 po the first day then once a day for the next 4 days. Patient not taking: Reported on 07/25/2020 05/13/20   Devoria Albe, MD  dicyclomine (BENTYL) 20 MG tablet Take 1  tablet (20 mg total) by mouth 2 (two) times daily. 03/06/20   Khatri, Hina, PA-C  ondansetron (ZOFRAN ODT) 4 MG disintegrating tablet Take 1 tablet (4 mg total) by mouth every 8 (eight) hours as needed for nausea or vomiting. Patient not taking: Reported on 03/06/2020 03/01/20   Henderly, Britni A, PA-C  predniSONE (DELTASONE) 20 MG tablet Take 3 po QD x 3d , then 2 po QD x 3d then 1 po QD x 3d Patient not taking: Reported on 07/25/2020 05/13/20   Devoria Albe, MD  sulfamethoxazole-trimethoprim (BACTRIM DS) 800-160 MG tablet Take 1 tablet by mouth 2 (two) times daily. Patient not taking: Reported on 07/25/2020 05/13/20   Devoria Albe, MD    Allergies    Aspirin  Review of Systems   Review of Systems  Cardiovascular: Positive for chest pain.    Gastrointestinal: Positive for abdominal pain.  Genitourinary: Positive for discharge.  All other systems reviewed and are negative.   Physical Exam Updated Vital Signs BP 130/84   Pulse (!) 101   Temp 98.4 F (36.9 C) (Oral)   Resp 12   SpO2 100%   Physical Exam Vitals and nursing note reviewed.  Constitutional:      General: He is not in acute distress.    Appearance: Normal appearance. He is well-developed.  HENT:     Head: Normocephalic and atraumatic.     Right Ear: Hearing normal.     Left Ear: Hearing normal.     Nose: Nose normal.  Eyes:     Conjunctiva/sclera: Conjunctivae normal.     Pupils: Pupils are equal, round, and reactive to light.  Cardiovascular:     Rate and Rhythm: Regular rhythm.     Heart sounds: S1 normal and S2 normal. No murmur heard.  No friction rub. No gallop.   Pulmonary:     Effort: Pulmonary effort is normal. No respiratory distress.     Breath sounds: Normal breath sounds.  Chest:     Chest [place]: No tenderness.  Abdominal:     General: Bowel sounds are normal.     Palpations: Abdomen is soft.     Tenderness: There is generalized abdominal tenderness. There is no guarding or rebound. Negative signs include Murphy's sign and McBurney's sign.     Hernia: No hernia is present.  Musculoskeletal:        General: Normal range of motion.     Cervical back: Normal range of motion and neck supple.  Skin:    General: Skin is warm and dry.     Findings: No rash.  Neurological:     Mental Status: He is alert and oriented to person, place, and time.     GCS: GCS eye subscore is 4. GCS verbal subscore is 5. GCS motor subscore is 6.     Cranial Nerves: No cranial nerve deficit.     Sensory: No sensory deficit.     Coordination: Coordination normal.  Psychiatric:        Speech: Speech normal.        Behavior: Behavior normal.        Thought Content: Thought content normal.     ED Results / Procedures / Treatments   Labs (all labs ordered  are listed, but only abnormal results are displayed) Labs Reviewed  URINALYSIS, ROUTINE W REFLEX MICROSCOPIC - Abnormal; Notable for the following components:      Result Value   Color, Urine AMBER (*)    APPearance HAZY (*)  Bilirubin Urine SMALL (*)    Ketones, ur 5 (*)    Protein, ur 30 (*)    Leukocytes,Ua SMALL (*)    WBC, UA >50 (*)    Bacteria, UA FEW (*)    All other components within normal limits  LIPASE, BLOOD - Abnormal; Notable for the following components:   Lipase 80 (*)    All other components within normal limits  COMPREHENSIVE METABOLIC PANEL - Abnormal; Notable for the following components:   Glucose, Bld 109 (*)    Total Protein 9.3 (*)    Albumin 3.2 (*)    All other components within normal limits  URINE CULTURE  CBC  GC/CHLAMYDIA PROBE AMP ([place]) NOT AT [facility]  TROPONIN I (HIGH SENSITIVITY)    EKG None  Radiology CT ABDOMEN PELVIS W CONTRAST  Result Date: 07/25/2020 CLINICAL DATA:  Epigastric and right lower quadrant pain with nausea. HIV positive. EXAM: CT ABDOMEN AND PELVIS WITH CONTRAST TECHNIQUE: Multidetector CT imaging of the abdomen and pelvis was performed using the standard protocol following bolus administration of intravenous contrast. CONTRAST:  OMNIPAQUE IOHEXOL 300 MG/ML  SOLN COMPARISON:  03/01/2020 FINDINGS: Lower chest: Unremarkable. Hepatobiliary: No suspicious focal abnormality within the liver parenchyma. There is no evidence for gallstones, gallbladder Zuno thickening, or pericholecystic fluid. No intrahepatic or extrahepatic biliary dilation. Pancreas: No focal mass lesion. No dilatation of the main duct. No intraparenchymal cyst. No peripancreatic edema. Spleen: No splenomegaly. No focal mass lesion. Adrenals/Urinary Tract: No adrenal nodule or mass. Kidneys unremarkable. No evidence for hydroureter. The urinary bladder appears normal for the degree of distention. Stomach/Bowel: Stomach is unremarkable. No gastric Canlas  thickening. No evidence of outlet obstruction. Duodenum is normally positioned as is the ligament of Treitz. No small bowel Kimmer thickening. No small bowel dilatation. Nonvisualization of the appendix is consistent with the reported history of appendectomy. No gross colonic mass. No colonic Ricciuti thickening. Vascular/Lymphatic: No abdominal aortic aneurysm. No abdominal aortic atherosclerotic calcification. There is no gastrohepatic or hepatoduodenal ligament lymphadenopathy. Upper normal retroperitoneal lymph nodes evident. Small lymph nodes noted along the pelvic sidewalls and groin regions, likely reactive. Reproductive: The prostate gland and seminal vesicles are unremarkable. Other: No intraperitoneal free fluid. Musculoskeletal: No worrisome lytic or sclerotic osseous abnormality. IMPRESSION: No acute findings in the abdomen or pelvis. Specifically, no findings to explain the patient's history of epigastric and right lower quadrant pain. Electronically Signed   By: Kennith Center M.D.   On: 07/25/2020 05:21   DG Chest Port 1 View  Result Date: 07/25/2020 CLINICAL DATA:  Chest pain EXAM: PORTABLE CHEST 1 VIEW COMPARISON:  07/20/2020 FINDINGS: Minimal left basilar atelectasis. Lungs are otherwise clear. No pneumothorax or pleural effusion. Cardiac size within normal limits. Pulmonary vascularity is normal. Osseous structures are age-appropriate. No acute bone abnormality. IMPRESSION: No active disease. Electronically Signed   By: Helyn Numbers MD   On: 07/25/2020 02:47    Procedures Procedures (including critical care time)  Medications Ordered in ED Medications  sodium chloride (PF) 0.9 % injection (has no administration in time range)  sodium chloride 0.9 % bolus 1,000 mL (0 mLs Intravenous Stopped 07/25/20 0306)  morphine 4 MG/ML injection 4 mg (4 mg Intravenous Given 07/25/20 0152)  ondansetron (ZOFRAN) injection 4 mg (4 mg Intravenous Given 07/25/20 0150)  HYDROmorphone (DILAUDID) injection  1 mg (1 mg Intravenous Given 07/25/20 0441)  cefTRIAXone (ROCEPHIN) 1 g in sodium chloride 0.9 % 100 mL IVPB (0 g Intravenous Stopped 07/25/20 0400)  iohexol (  OMNIPAQUE) 300 MG/ML solution 100 mL (100 mLs Intravenous Contrast Given 07/25/20 0448)    ED Course  I have reviewed the triage vital signs and the nursing notes.  Pertinent labs & imaging results that were available during my care of the patient were reviewed by me and considered in my medical decision making (see chart for details).    MDM Rules/Calculators/A&P                          Patient presents with multiple problems.  He is complaining of chest pain and abdominal pain.  He has been seen in the recent past for similar complaints.  Symptoms continue.  Lab work reveals very slightly elevated lipase, unclear significance.  Remainder of blood work was unremarkable.  EKG does not suggest ischemia or infarct.  No significant cardiac risk factors and symptoms are extremely atypical.  Troponin negative.  Symptoms have been present for days, does not require repeat troponin.  Pain is predominantly epigastric at this time and he does have a moderate amount of abdominal tenderness.  CT abdomen and pelvis therefore performed.  No acute abnormality is noted.  Patient does complain of urethral discharge.  Urinalysis with a large amount of white blood cells with rare bacteria.  This is concerning for urethritis consistent with STI.  GC and Chlamydia pending.  Patient administered Rocephin here in the ED.  Will discharge with Keflex to cover for UTI as well as doxycycline to cover for chlamydia.  Patient has been off of his HIV medication again.  He needs to get in touch with Dr. Daiva EvesVan Dam to get back on his meds.  Final Clinical Impression(s) / ED Diagnoses Final diagnoses:  Epigastric pain  Atypical chest pain  Urethritis    Rx / DC Orders ED Discharge Orders    None       Gilda CreasePollina, Emaly Boschert J, MD 07/25/20 [phone]

## 2020-07-25 NOTE — ED Triage Notes (Signed)
Patient arrives complaining of penile discharge, chest pain, and abd pain. Patient is HIV+ and has not been taking his medications for about a month. Patient endorses epigastric and RLQ pain. Patient endorses nausea, no vomiting and no diarrhea.

## 2020-07-26 LAB — URINE CULTURE: Culture: NO GROWTH

## 2020-07-26 LAB — GC/CHLAMYDIA PROBE AMP (~~LOC~~) NOT AT ARMC
Chlamydia: NEGATIVE
Comment: NEGATIVE
Comment: NORMAL
Neisseria Gonorrhea: POSITIVE — AB

## 2020-11-25 ENCOUNTER — Emergency Department (HOSPITAL_COMMUNITY)
Admission: EM | Admit: 2020-11-25 | Discharge: 2020-11-25 | Disposition: A | Discharge status: Home / Self Care | Payer: Self-pay | Attending: Emergency Medicine | Admitting: Emergency Medicine

## 2020-11-25 ENCOUNTER — Other Ambulatory Visit: Payer: Self-pay

## 2020-11-25 ENCOUNTER — Encounter (HOSPITAL_COMMUNITY): Payer: Self-pay | Admitting: *Deleted

## 2020-11-25 ENCOUNTER — Emergency Department (HOSPITAL_COMMUNITY): Payer: Self-pay

## 2020-11-25 DIAGNOSIS — N451 Epididymitis: Secondary | ICD-10-CM

## 2020-11-25 DIAGNOSIS — N50812 Left testicular pain: Secondary | ICD-10-CM

## 2020-11-25 DIAGNOSIS — F17210 Nicotine dependence, cigarettes, uncomplicated: Secondary | ICD-10-CM | POA: Insufficient documentation

## 2020-11-25 DIAGNOSIS — Z7952 Long term (current) use of systemic steroids: Secondary | ICD-10-CM | POA: Insufficient documentation

## 2020-11-25 DIAGNOSIS — D72829 Elevated white blood cell count, unspecified: Secondary | ICD-10-CM | POA: Insufficient documentation

## 2020-11-25 DIAGNOSIS — N5089 Other specified disorders of the male genital organs: Secondary | ICD-10-CM

## 2020-11-25 DIAGNOSIS — J45909 Unspecified asthma, uncomplicated: Secondary | ICD-10-CM | POA: Insufficient documentation

## 2020-11-25 DIAGNOSIS — Z21 Asymptomatic human immunodeficiency virus [HIV] infection status: Secondary | ICD-10-CM | POA: Insufficient documentation

## 2020-11-25 HISTORY — DX: Human immunodeficiency virus (HIV) disease: B20

## 2020-11-25 LAB — CBC
HCT: 40.8 % (ref 39.0–52.0)
Hemoglobin: 13.2 g/dL (ref 13.0–17.0)
MCH: 26.7 pg (ref 26.0–34.0)
MCHC: 32.4 g/dL (ref 30.0–36.0)
MCV: 82.6 fL (ref 80.0–100.0)
Platelets: 287 10*3/uL (ref 150–400)
RBC: 4.94 MIL/uL (ref 4.22–5.81)
RDW: 14.6 % (ref 11.5–15.5)
WBC: 10.5 10*3/uL (ref 4.0–10.5)
nRBC: 0 % (ref 0.0–0.2)

## 2020-11-25 LAB — COMPREHENSIVE METABOLIC PANEL
ALT: 12 U/L (ref 0–44)
AST: 15 U/L (ref 15–41)
Albumin: 3.1 g/dL — ABNORMAL LOW (ref 3.5–5.0)
Alkaline Phosphatase: 82 U/L (ref 38–126)
Anion gap: 8 (ref 5–15)
BUN: 6 mg/dL (ref 6–20)
CO2: 24 mmol/L (ref 22–32)
Calcium: 8.7 mg/dL — ABNORMAL LOW (ref 8.9–10.3)
Chloride: 102 mmol/L (ref 98–111)
Creatinine, Ser: 0.92 mg/dL (ref 0.61–1.24)
GFR, Estimated: >60 mL/min (ref >60)
Glucose, Bld: 109 mg/dL — ABNORMAL HIGH (ref 70–99)
Potassium: 4.2 mmol/L (ref 3.5–5.1)
Sodium: 134 mmol/L — ABNORMAL LOW (ref 135–145)
Total Bilirubin: 0.8 mg/dL (ref 0.3–1.2)
Total Protein: 9.9 g/dL — ABNORMAL HIGH (ref 6.5–8.1)

## 2020-11-25 LAB — URINALYSIS, ROUTINE W REFLEX MICROSCOPIC
Bacteria, UA: NONE SEEN
Bilirubin Urine: NEGATIVE
Glucose, UA: NEGATIVE mg/dL
Ketones, ur: NEGATIVE mg/dL
Nitrite: NEGATIVE
Protein, ur: 30 mg/dL — AB
RBC / HPF: >50 RBC/hpf — ABNORMAL HIGH (ref 0–5)
Specific Gravity, Urine: 1.021 (ref 1.005–1.030)
WBC, UA: >50 WBC/hpf — ABNORMAL HIGH (ref 0–5)
pH: 5 (ref 5.0–8.0)

## 2020-11-25 LAB — LIPASE, BLOOD: Lipase: 78 U/L — ABNORMAL HIGH (ref 11–51)

## 2020-11-25 MED ORDER — DOXYCYCLINE HYCLATE 100 MG PO TABS
100.0000 mg | ORAL_TABLET | Freq: Once | ORAL | Status: AC
  Administered 2020-11-25: 100 mg via ORAL
  Filled 2020-11-25: qty 1

## 2020-11-25 MED ORDER — OXYCODONE-ACETAMINOPHEN 5-325 MG PO TABS
1.0000 | ORAL_TABLET | Freq: Once | ORAL | Status: AC
  Administered 2020-11-25: 1 via ORAL
  Filled 2020-11-25: qty 1

## 2020-11-25 MED ORDER — LIDOCAINE HCL (PF) 1 % IJ SOLN
1.0000 mL | Freq: Once | INTRAMUSCULAR | Status: AC
  Administered 2020-11-25: 1 mL
  Filled 2020-11-25: qty 30

## 2020-11-25 MED ORDER — CEFTRIAXONE SODIUM 1 G IJ SOLR
500.0000 mg | Freq: Once | INTRAMUSCULAR | Status: AC
  Administered 2020-11-25: 500 mg via INTRAMUSCULAR
  Filled 2020-11-25: qty 10

## 2020-11-25 MED ORDER — DOXYCYCLINE HYCLATE 100 MG PO CAPS: 100.0000 mg | ORAL_CAPSULE | Freq: Two times a day (BID) | ORAL | 0 refills | Status: AC

## 2020-11-25 MED ORDER — SODIUM CHLORIDE 0.9 % IV BOLUS: 1000.0000 mL | Freq: Once | INTRAVENOUS | Status: DC

## 2020-11-25 MED ORDER — OXYCODONE-ACETAMINOPHEN 5-325 MG PO TABS
2.0000 | ORAL_TABLET | Freq: Four times a day (QID) | ORAL | 0 refills | Status: AC | PRN
Start: 2020-11-25 — End: 2020-11-27

## 2020-11-25 NOTE — ED Notes (Signed)
Pt unable to give urine sample at this moment.

## 2020-11-25 NOTE — ED Provider Notes (Signed)
[facility] DEPT Provider Note   CSN: [id number] Arrival date & time: 11/25/20  1719     History Chief Complaint  Patient presents with  . Abdominal Pain  . Groin Swelling    Travis Herring is a 35 y.o. male with a history of HIV (seen by Dr. Tommy Medal; no meds x2 months), asthma.  Patient presents with chief complaint of left testicular pain.  Reports that his pain began 1 to 2 days prior.  Pain is constant, has been progressively worsening.  Patient rates his pain as a 10/10 on the pain scale.  Pain is worse with certain positions and to touch.  Patient reports no alleviating factors.  He reports that his pain radiates up into his suprapubic and left lower abdomen.  He also reports that he has noticed swelling to his left testicle.    Patient endorses chills constipation, and urinary frequency. Patient denies dysuria, hematuria, penile discharge, genital sores or lesions, rectal pain, rectal bleeding, fevers, nausea, vomiting, back pain, lightheadedness, dizziness, or syncopal episodes.   Patient reports he is sexually active with men and women.  Patient reports that he does not always use condoms with sex.  Patient denies any partners with known STI.  Per chart review patient has previously been treated for sexually transmitted infections.  Denies any receptive anal sex.   He endorses drinking 3 beers daily.  Endorses history of cocaine use however denies any cocaine use recently.  Endorses smoking black in [place].   HPI     Past Medical History:  Diagnosis Date  . Asthma   . Cocaine use 09/20/2019  . HIV disease ([place])   . Homelessness 12/12/2019    Patient Active Problem List   Diagnosis Date Noted  . Homelessness 12/12/2019  . Asthma 09/15/2019  . HIV disease ([place]) 12/29/2013  . Tattoo 12/29/2013    Past Surgical History:  Procedure Laterality Date  . APPENDECTOMY         Family History  Problem Relation Age of Onset  . Hypertension  Mother   . Hypertension Sister   . Diabetes Maternal Grandmother   . Heart disease Maternal Grandmother   . Hypertension Maternal Grandmother   . Cancer Maternal Grandfather        colon  . Aneurysm Maternal Grandfather   . Heart disease Paternal Grandmother   . Hypertension Sister     Social History   Tobacco Use  . Smoking status: Current Some Day Smoker    Packs/day: 0.25    Types: Cigarettes, Cigars    Last attempt to quit: 10/13/2013    Years since quitting: 7.1  . Smokeless tobacco: Never Used  . Tobacco comment: black and milds; 3 per day  Substance Use Topics  . Alcohol use: Yes    Alcohol/week: 1.0 standard drink    Types: 1 Cans of beer per week    Comment: occassionally   . Drug use: Yes    Frequency: 7.0 times per week    Types: Marijuana    Comment: 1-3 blunts daily; cocaine occassionally     Home Medications Prior to Admission medications   Medication Sig Start Date End Date Taking? Authorizing Provider  azithromycin (ZITHROMAX) 250 MG tablet Take 2 po the first day then once a day for the next 4 days. Patient not taking: Reported on 07/25/2020 05/13/20   Rolland Porter, MD  bictegravir-emtricitabine-tenofovir AF (BIKTARVY) 50-200-25 MG TABS tablet Take 1 tablet by mouth daily. 12/12/19  Tommy Medal, Lavell Islam, MD  cephALEXin (KEFLEX) 500 MG capsule Take 1 capsule (500 mg total) by mouth 2 (two) times daily. 07/25/20   Orpah Greek, MD  dicyclomine (BENTYL) 20 MG tablet Take 1 tablet (20 mg total) by mouth 2 (two) times daily. 03/06/20   Khatri, Hina, PA-C  doxycycline (VIBRAMYCIN) 100 MG capsule Take 1 capsule (100 mg total) by mouth 2 (two) times daily. 07/25/20   Orpah Greek, MD  Fluticasone-Salmeterol (ADVAIR DISKUS) 100-50 MCG/DOSE AEPB Inhale 1 puff into the lungs 2 (two) times daily. Patient taking differently: Inhale 1 puff into the lungs daily as needed (sob).  09/20/19   Truman Hayward, MD  ondansetron (ZOFRAN ODT) 4 MG disintegrating  tablet Take 1 tablet (4 mg total) by mouth every 8 (eight) hours as needed for nausea or vomiting. Patient not taking: Reported on 03/06/2020 03/01/20   Henderly, Britni A, PA-C  predniSONE (DELTASONE) 20 MG tablet Take 3 po QD x 3d , then 2 po QD x 3d then 1 po QD x 3d Patient not taking: Reported on 07/25/2020 05/13/20   Rolland Porter, MD  sulfamethoxazole-trimethoprim (BACTRIM DS) 800-160 MG tablet Take 1 tablet by mouth 2 (two) times daily. Patient not taking: Reported on 07/25/2020 05/13/20   Rolland Porter, MD    Allergies    Aspirin  Review of Systems   Review of Systems  Constitutional: Positive for chills. Negative for fever.  Eyes: Negative for visual disturbance.  Respiratory: Negative for shortness of breath.   Cardiovascular: Negative for chest pain.  Gastrointestinal: Positive for abdominal pain and constipation. Negative for abdominal distention, anal bleeding, blood in stool, diarrhea, nausea, rectal pain and vomiting.  Genitourinary: Positive for frequency, scrotal swelling and testicular pain. Negative for difficulty urinating, dysuria, flank pain, genital sores, hematuria, penile discharge, penile pain and penile swelling.  Musculoskeletal: Negative for back pain and neck pain.  Skin: Negative for color change and rash.  Neurological: Negative for dizziness, syncope, light-headedness and headaches.  Psychiatric/Behavioral: Negative for confusion.    Physical Exam Updated Vital Signs BP (!) 154/96 (BP Location: Right Arm)   Pulse 94   Temp 99.3 F (37.4 C) (Oral)   Resp 18   Ht '5\' 11"'  (1.803 m)   Wt 72.6 kg   SpO2 100%   BMI 22.32 kg/m   Physical Exam Vitals and nursing note reviewed. Exam conducted with a chaperone present (male RN tech present).  Constitutional:      General: He is not in acute distress.    Appearance: He is not ill-appearing, toxic-appearing or diaphoretic.  HENT:     Head: Normocephalic.  Eyes:     General: No scleral icterus.       Right  eye: No discharge.        Left eye: No discharge.  Cardiovascular:     Rate and Rhythm: Normal rate.     Heart sounds: Normal heart sounds.  Pulmonary:     Effort: Pulmonary effort is normal. No tachypnea, bradypnea or respiratory distress.     Breath sounds: Normal breath sounds. No stridor.  Abdominal:     General: Abdomen is flat. Bowel sounds are normal. There is no distension. There are no signs of injury.     Palpations: Abdomen is soft. There is no mass or pulsatile mass.     Tenderness: There is abdominal tenderness in the suprapubic area.  Genitourinary:    Penis: Circumcised. No tenderness, discharge, swelling or lesions.  Testes: Cremasteric reflex is present.        Right: Mass, tenderness, swelling, testicular hydrocele or varicocele not present. Cremasteric reflex is present.         Left: Tenderness and swelling present. Mass, testicular hydrocele or varicocele not present. Cremasteric reflex is present.      Epididymis:     Right: Not inflamed or enlarged. No mass or tenderness.     Left: Inflamed and enlarged. Tenderness present. No mass.     Tanner stage (genital): 5.  Musculoskeletal:     Cervical back: Neck supple.     Right lower leg: No edema.     Left lower leg: No edema.  Skin:    General: Skin is warm and dry.     Coloration: Skin is not jaundiced or pale.  Neurological:     General: No focal deficit present.     Mental Status: He is alert.  Psychiatric:        Behavior: Behavior is cooperative.     ED Results / Procedures / Treatments   Labs (all labs ordered are listed, but only abnormal results are displayed) Labs Reviewed  LIPASE, BLOOD - Abnormal; Notable for the following components:      Result Value   Lipase 78 (*)    All other components within normal limits  COMPREHENSIVE METABOLIC PANEL - Abnormal; Notable for the following components:   Sodium 134 (*)    Glucose, Bld 109 (*)    Calcium 8.7 (*)    Total Protein 9.9 (*)     Albumin 3.1 (*)    All other components within normal limits  URINALYSIS, ROUTINE W REFLEX MICROSCOPIC - Abnormal; Notable for the following components:   APPearance CLOUDY (*)    Hgb urine dipstick MODERATE (*)    Protein, ur 30 (*)    Leukocytes,Ua LARGE (*)    RBC / HPF >50 (*)    WBC, UA >50 (*)    All other components within normal limits  URINE CULTURE  CBC  RPR  GC/CHLAMYDIA PROBE AMP ([place]) NOT AT [facility]  GC/CHLAMYDIA PROBE AMP ([employer]) NOT AT [facility]    EKG None  Radiology US SCROTUM W/DOPPLER  Result Date: 11/25/2020 CLINICAL DATA:  Left testicular pain and scrotal swelling. Symptoms for 2 days. EXAM: SCROTAL ULTRASOUND DOPPLER ULTRASOUND OF THE TESTICLES TECHNIQUE: Complete ultrasound examination of the testicles, epididymis, and other scrotal structures was performed. Color and spectral Doppler ultrasound were also utilized to evaluate blood flow to the testicles. COMPARISON:  None. FINDINGS: Right testicle Measurements: 4.6 x 2.5 x 2.4 cm. Testicular echogenicity is diffusely heterogeneous with multiple echogenic foci. No definite posterior shadowing to suggest microlithiasis. No focal testicular mass. Blood flow is noted. Left testicle Measurements: 4.0 x 2.6 x 2.7 cm. Testicular echogenicity is diffusely heterogeneous with multiple echogenic foci. No definite posterior shadowing to suggest microlithiasis. No focal testicular mass. Blood flow is noted. Right epididymis:  Normal in size and appearance. Left epididymis: Enlarged with diffusely increased vascularity. Incidental 5 minimal epididymal head cyst. Hydrocele:  Small on the left. Varicocele:  None visualized. Pulsed Doppler interrogation of both testes demonstrates normal low resistance arterial and venous waveforms bilaterally. IMPRESSION: 1. Enlarged hypervascular left epididymis most consistent with epididymitis. 2. Diffusely heterogeneous testicular parenchyma of both testis with multiple echogenic foci. No  definite posterior shadowing to suggest testicular microlithiasis. Suspect systemic causes the chest sequela of prior infection or inflammation. 3. Normal blood flow to both testis without evidence  of torsion. Electronically Signed   By: Keith Rake M.D.   On: 11/25/2020 20:25    Procedures Procedures   Medications Ordered in ED Medications  oxyCODONE-acetaminophen (PERCOCET/ROXICET) 5-325 MG per tablet 1 tablet (1 tablet Oral Given 11/25/20 1925)  cefTRIAXone (ROCEPHIN) injection 500 mg (500 mg Intramuscular Given 11/25/20 2131)  lidocaine (PF) (XYLOCAINE) 1 % injection 1 mL (1 mL Other Given 11/25/20 2133)  doxycycline (VIBRA-TABS) tablet 100 mg (100 mg Oral Given 11/25/20 2131)    ED Course  I have reviewed the triage vital signs and the nursing notes.  Pertinent labs & imaging results that were available during my care of the patient were reviewed by me and considered in my medical decision making (see chart for details).    MDM Rules/Calculators/A&P                          Alert 35 year old male in no acute distress, nontoxic appearing.  Patient is HIV positive and reports he has not had medications for the past 2 months.  Patient presents with chief complaint of left testicular pain and swelling.  Patient reports he is sexually active with men and women, is not always use condoms when having sex.  Denies any receptive anal sex.  Denies dysuria, hematuria, penile discharge, genital sores or lesions, rectal pain, rectal bleeding, fevers, nausea, vomiting, back pain.  Normal active bowel sounds, abdomen is soft, nondistended.  Tenderness to suprapubic area.  Cremasteric reflex intact bilaterally.  Noted to have swelling and tenderness to left testicle.  Left epididymis is inflamed, enlarged and tender.    CMP, CBC, lipase and UA were ordered while patient was in waiting room.  CBC is within normal limits.  CMP showed AST, ALT, alk phos and total bili all within normal limits;  concerning for hepatobiliary disease.  Patient is slightly elevated at 70; less concerning for acute pancreatitis as the value is not 3 times normal limit.  Ultrasound ordered to assess for epididymitis.  Will swab for gonorrhea and chlamydia and test for syphilis.    Ultrasound showed: 1) Enlarged hypervascular left epididymis most consistent with epididymitis. 2) 3. Normal blood flow to both testis without evidence of torsion.  UA showed no bacteria seen, WBC > 50, RBC>50, leukocytes large.  Less concerning for E. Coli as source of epididymitis however will culture.    Patient was given ceftriaxone and doxycycline.  Patient's pain is managed with Percocet.  Will discharge patient with 10-day course of doxycycline 2-day course of Percocet.  Patient noted to be hypertensive during hospitalization.  Will have patient follow-up with his primary care provider for further hypertension evaluation and will have him check blood pressure at home.  Was advised that he needs to follow-up with Dr. Tommy Medal to restart his HIV medications.  Patient with cocci gonorrhea, chlamydia, and syphilis tests are pending.  Patient is to seek immediate medical treatment if syphilis test positive.  Discussed results, findings, treatment and follow up. Patient advised of return precautions. Patient verbalized understanding and agreed with plan.   Final Clinical Impression(s) / ED Diagnoses Final diagnoses:  Epididymitis    Rx / DC Orders ED Discharge Orders         Ordered    doxycycline (VIBRAMYCIN) 100 MG capsule  2 times daily        11/25/20 2125    oxyCODONE-acetaminophen (PERCOCET/ROXICET) 5-325 MG tablet  Every 6 hours PRN  11/25/20 2125           Loni Beckwith, PA-C 11/25/20 2219    Varney Biles, MD 11/25/20 2235

## 2020-11-25 NOTE — ED Triage Notes (Signed)
Pt has been out of HIV meds for 2 months, unable to make appointment due to job. abd pain and left testicular pain is reason for visit.

## 2020-11-25 NOTE — Discharge Instructions (Signed)
You came to the emergency department to be evaluated for your left testicular pain and swelling.  Your physical exam and the ultrasound are consistent with epididymitis.  This patient is most likely caused by gonorrhea and/or chlamydia.  You were swabbed for gonorrhea/chlamydia, and also tested for syphilis these results are currently pending.  You will be contacted with a positive result and you can also find your results on [facility] MyChart.  If you test positive for syphilis please go to [facility] department for treatment.  You were treated for gonorrhea chlamydia.  Please continue to take the doxycycline medication, 1 pill twice a day for the next 10 days.  A side effect of this medication includes hypersensitivity to the suns rays - please take measures to protect your skin from the sun while taking this medication.  Please continue to use medication until the prescription is completed began to feel better.  I have given you prescription for Percocet to use 1 pill as needed every 6 hours for severe pain.  Use over-the-counter pain medication for mild pain.    Today you received medications that may make you sleepy or impair your ability to make decisions.  For the next 24 hours please do not drive, operate heavy machinery, care for a small child with out another adult present, or perform any activities that may cause harm to you or someone else if you were to fall asleep or be impaired.   You are being prescribed a medication which may make you sleepy. Please follow up of listed precautions for at least 24 hours after taking one dose.  Please return to the emergency department if: You have a fever. Your pain medicine is not helping. Your pain is getting worse. Your symptoms do not improve within 3 days

## 2020-11-26 ENCOUNTER — Telehealth: Payer: Self-pay

## 2020-11-26 LAB — GC/CHLAMYDIA PROBE AMP (~~LOC~~) NOT AT ARMC
Chlamydia: NEGATIVE
Comment: NEGATIVE
Comment: NORMAL
Neisseria Gonorrhea: NEGATIVE

## 2020-11-26 LAB — RPR
RPR Ser Ql: REACTIVE — AB
RPR Titer: 1:16 {titer}

## 2020-11-26 NOTE — Telephone Encounter (Signed)
Transition Care Management Unsuccessful Follow-up Telephone Call  Date of discharge and from where:  11/25/20 from [place]  Attempts:  1st Attempt  Reason for unsuccessful TCM follow-up call:  Left voice message     

## 2020-11-27 LAB — T.PALLIDUM AB, TOTAL: T Pallidum Abs: REACTIVE — AB

## 2020-11-27 NOTE — Telephone Encounter (Signed)
Transition Care Management Unsuccessful Follow-up Telephone Call  Date of discharge and from where:  11/25/20 from [place]  Attempts:  2nd Attempt  Reason for unsuccessful TCM follow-up call:  Left voice message     

## 2020-11-28 ENCOUNTER — Emergency Department (HOSPITAL_COMMUNITY)
Admission: EM | Admit: 2020-11-28 | Discharge: 2020-11-28 | Disposition: A | Discharge status: Home / Self Care | Payer: Self-pay | Attending: Emergency Medicine | Admitting: Emergency Medicine

## 2020-11-28 ENCOUNTER — Telehealth: Payer: Self-pay

## 2020-11-28 ENCOUNTER — Encounter (HOSPITAL_COMMUNITY): Payer: Self-pay

## 2020-11-28 DIAGNOSIS — N451 Epididymitis: Secondary | ICD-10-CM | POA: Insufficient documentation

## 2020-11-28 DIAGNOSIS — Z7951 Long term (current) use of inhaled steroids: Secondary | ICD-10-CM | POA: Insufficient documentation

## 2020-11-28 DIAGNOSIS — J45909 Unspecified asthma, uncomplicated: Secondary | ICD-10-CM | POA: Insufficient documentation

## 2020-11-28 DIAGNOSIS — A539 Syphilis, unspecified: Secondary | ICD-10-CM | POA: Insufficient documentation

## 2020-11-28 DIAGNOSIS — F17210 Nicotine dependence, cigarettes, uncomplicated: Secondary | ICD-10-CM | POA: Insufficient documentation

## 2020-11-28 DIAGNOSIS — Z21 Asymptomatic human immunodeficiency virus [HIV] infection status: Secondary | ICD-10-CM | POA: Insufficient documentation

## 2020-11-28 LAB — URINE CULTURE: Culture: 50000 — AB

## 2020-11-28 MED ORDER — HYDROCODONE-ACETAMINOPHEN 5-325 MG PO TABS: 1.0000 | ORAL_TABLET | Freq: Four times a day (QID) | ORAL | 0 refills | Status: DC | PRN

## 2020-11-28 MED ORDER — KETOROLAC TROMETHAMINE 60 MG/2ML IM SOLN
60.0000 mg | Freq: Once | INTRAMUSCULAR | Status: AC
  Administered 2020-11-28: 60 mg via INTRAMUSCULAR
  Filled 2020-11-28: qty 2

## 2020-11-28 MED ORDER — PENICILLIN G BENZATHINE 1200000 UNIT/2ML IM SUSP
2.4000 10*6.[IU] | Freq: Once | INTRAMUSCULAR | Status: AC
  Administered 2020-11-28: 2.4 10*6.[IU] via INTRAMUSCULAR
  Filled 2020-11-28: qty 4

## 2020-11-28 MED ORDER — HYDROMORPHONE HCL 1 MG/ML IJ SOLN
1.0000 mg | Freq: Once | INTRAMUSCULAR | Status: DC
  Filled 2020-11-28: qty 1

## 2020-11-28 NOTE — Telephone Encounter (Signed)
Left patient a voice mail to call back to schedule financial, labs and office visit with Dr. Daiva Eves. Is overdue for all 3 and is needing medication.

## 2020-11-28 NOTE — ED Provider Notes (Signed)
[facility] Provider Note   CSN: [id number] Arrival date & time: 11/28/20  1628     History Chief Complaint  Patient presents with  . Abdominal Pain  . Groin Swelling    Travis Herring is a 35 y.o. male.  Patient complains of left testicular pain.  He was seen here 3 days ago and had an ultrasound that showed epididymitis, patient was given Zithromax Rocephin and a prescription of doxycycline with Percocets.  Patient returns today for worsening symptoms.  The history is provided by the patient and medical records. No language interpreter was used.  Testicle Pain This is a recurrent problem. The current episode started 12 to 24 hours ago. The problem occurs constantly. The problem has not changed since onset.Pertinent negatives include no chest pain, no abdominal pain and no headaches. Nothing aggravates the symptoms. Nothing relieves the symptoms. He has tried nothing for the symptoms.       Past Medical History:  Diagnosis Date  . Asthma   . Cocaine use 09/20/2019  . HIV disease ([facility])   . Homelessness 12/12/2019    Patient Active Problem List   Diagnosis Date Noted  . Homelessness 12/12/2019  . Asthma 09/15/2019  . HIV disease ([facility]) 12/29/2013  . Tattoo 12/29/2013    Past Surgical History:  Procedure Laterality Date  . APPENDECTOMY         Family History  Problem Relation Age of Onset  . Hypertension Mother   . Hypertension Sister   . Diabetes Maternal Grandmother   . Heart disease Maternal Grandmother   . Hypertension Maternal Grandmother   . Cancer Maternal Grandfather        colon  . Aneurysm Maternal Grandfather   . Heart disease Paternal Grandmother   . Hypertension Sister     Social History   Tobacco Use  . Smoking status: Current Some Day Smoker    Packs/day: 0.25    Types: Cigarettes, Cigars    Last attempt to quit: 10/13/2013    Years since quitting: 7.1  . Smokeless tobacco: Never Used  . Tobacco comment:  black and milds; 3 per day  Substance Use Topics  . Alcohol use: Yes    Alcohol/week: 1.0 standard drink    Types: 1 Cans of beer per week    Comment: occassionally   . Drug use: Yes    Frequency: 7.0 times per week    Types: Marijuana    Comment: 1-3 blunts daily; cocaine occassionally     Home Medications Prior to Admission medications   Medication Sig Start Date End Date Taking? Authorizing Provider  azithromycin (ZITHROMAX) 250 MG tablet Take 2 po the first day then once a day for the next 4 days. Patient not taking: Reported on 07/25/2020 05/13/20   Devoria Albe, MD  bictegravir-emtricitabine-tenofovir AF (BIKTARVY) 50-200-25 MG TABS tablet Take 1 tablet by mouth daily. 12/12/19   Randall Hiss, MD  cephALEXin (KEFLEX) 500 MG capsule Take 1 capsule (500 mg total) by mouth 2 (two) times daily. 07/25/20   Gilda Crease, MD  dicyclomine (BENTYL) 20 MG tablet Take 1 tablet (20 mg total) by mouth 2 (two) times daily. 03/06/20   Khatri, Hina, PA-C  doxycycline (VIBRAMYCIN) 100 MG capsule Take 1 capsule (100 mg total) by mouth 2 (two) times daily for 10 days. 11/25/20 12/05/20  Haskel Schroeder, PA-C  Fluticasone-Salmeterol (ADVAIR DISKUS) 100-50 MCG/DOSE AEPB Inhale 1 puff into the lungs 2 (two) times daily. Patient taking  differently: Inhale 1 puff into the lungs daily as needed (sob).  09/20/19   Randall Hiss, MD  HYDROcodone-acetaminophen (NORCO/VICODIN) 5-325 MG tablet Take 1 tablet by mouth every 6 (six) hours as needed for moderate pain. 11/28/20   Bethann Berkshire, MD  ondansetron (ZOFRAN ODT) 4 MG disintegrating tablet Take 1 tablet (4 mg total) by mouth every 8 (eight) hours as needed for nausea or vomiting. Patient not taking: Reported on 03/06/2020 03/01/20   Henderly, Britni A, PA-C  predniSONE (DELTASONE) 20 MG tablet Take 3 po QD x 3d , then 2 po QD x 3d then 1 po QD x 3d Patient not taking: Reported on 07/25/2020 05/13/20   Devoria Albe, MD   sulfamethoxazole-trimethoprim (BACTRIM DS) 800-160 MG tablet Take 1 tablet by mouth 2 (two) times daily. Patient not taking: Reported on 07/25/2020 05/13/20   Devoria Albe, MD    Allergies    Aspirin  Review of Systems   Review of Systems  Constitutional: Negative for appetite change and fatigue.  HENT: Negative for congestion, ear discharge and sinus pressure.   Eyes: Negative for discharge.  Respiratory: Negative for cough.   Cardiovascular: Negative for chest pain.  Gastrointestinal: Negative for abdominal pain and diarrhea.  Genitourinary: Positive for testicular pain. Negative for frequency and hematuria.  Musculoskeletal: Negative for back pain.  Skin: Negative for rash.  Neurological: Negative for seizures and headaches.  Psychiatric/Behavioral: Negative for hallucinations.    Physical Exam Updated Vital Signs BP 130/90   Pulse 98   Temp 98 F (36.7 C)   Resp 15   Ht 5\' 11"  (1.803 m)   Wt 71.7 kg   SpO2 100%   BMI 22.04 kg/m   Physical Exam Vitals and nursing note reviewed.  Constitutional:      Appearance: He is well-developed.  HENT:     Head: Normocephalic.     Nose: Nose normal.  Eyes:     General: No scleral icterus.    Extraocular Movements: EOM normal.     Conjunctiva/sclera: Conjunctivae normal.  Neck:     Thyroid: No thyromegaly.  Cardiovascular:     Rate and Rhythm: Normal rate and regular rhythm.     Heart sounds: No murmur heard. No friction rub. No gallop.   Pulmonary:     Breath sounds: No stridor. No wheezing or rales.  Chest:     Chest [place]: No tenderness.  Abdominal:     General: There is no distension.     Tenderness: There is no abdominal tenderness. There is no rebound.  Genitourinary:    Comments: Tender swollen left scrotum Musculoskeletal:        General: No edema. Normal range of motion.     Cervical back: Neck supple.  Lymphadenopathy:     Cervical: No cervical adenopathy.  Skin:    Findings: No erythema or rash.   Neurological:     Mental Status: He is alert and oriented to person, place, and time.     Motor: No abnormal muscle tone.     Coordination: Coordination normal.  Psychiatric:        Mood and Affect: Mood and affect normal.        Behavior: Behavior normal.     ED Results / Procedures / Treatments   Labs (all labs ordered are listed, but only abnormal results are displayed) Labs Reviewed - No data to display  EKG None  Radiology No results found.  Procedures Procedures   Medications Ordered in ED  Medications  penicillin g benzathine (BICILLIN LA) 1200000 UNIT/2ML injection 2.4 Million Units (has no administration in time range)    ED Course  I have reviewed the triage vital signs and the nursing notes.  Pertinent labs & imaging results that were available during my care of the patient were reviewed by me and considered in my medical decision making (see chart for details). Patient with epididymitis.  His syphilis test was positive.  He also has HIV and has not been on his medicines for 2 months.  He has appointment tomorrow with infectious disease.  I spoke with infectious disease and they recommended giving the patient Bicillin and they will see him tomorrow.  I also wrote him for Vicodin to see if that would help better for pain and he is referred to urology for the epididymitis   MDM Rules/Calculators/A&P                         Patient with HIV and epididymitis.  Along with positive syphilis.  Patient given Bicillin and will see ID tomorrow and referred to urology.  Patient continuing the doxycycline and given Vicodin Final Clinical Impression(s) / ED Diagnoses Final diagnoses:  Epididymitis  Syphilis    Rx / DC Orders ED Discharge Orders         Ordered    HYDROcodone-acetaminophen (NORCO/VICODIN) 5-325 MG tablet  Every 6 hours PRN,   Status:  Discontinued        11/28/20 1736    HYDROcodone-acetaminophen (NORCO/VICODIN) 5-325 MG tablet  Every 6 hours PRN         11/28/20 1737           Bethann Berkshire, MD 11/28/20 1741

## 2020-11-28 NOTE — ED Notes (Signed)
RN asked pt about aspirin allergy. Pt verbalized that his allergy to aspirin was not serious and that it just makes his stomach hurt.

## 2020-11-28 NOTE — Discharge Instructions (Signed)
Follow-up with your infectious disease doctor tomorrow as planned.  You have also been referred to urology for your scrotal pain and you need to follow-up with them next week

## 2020-11-28 NOTE — ED Triage Notes (Signed)
Pt presents with c/o testicular swelling on the left side and abdominal pain. Pt seen for same earlier this week but does report hematuria that is new.

## 2020-11-28 NOTE — Telephone Encounter (Signed)
Transition Care Management Unsuccessful Follow-up Telephone Call  Date of discharge and from where:  11/25/20 from [facility]  Attempts:  3rd Attempt  Reason for unsuccessful TCM follow-up call:  Left voice message

## 2020-11-29 ENCOUNTER — Telehealth: Payer: Self-pay

## 2020-11-29 ENCOUNTER — Ambulatory Visit: Payer: Self-pay

## 2020-11-29 ENCOUNTER — Telehealth: Payer: Self-pay | Admitting: *Deleted

## 2020-11-29 ENCOUNTER — Other Ambulatory Visit: Payer: Self-pay

## 2020-11-29 NOTE — Telephone Encounter (Signed)
Patient returning call. Received first Bicillin injection in emergency department 11/28/20. Patient scheduled for next two doses on 12/05/20 and 12/12/20. Advised patient no sex until treatment is completed plus an additional 10 days and instructed to notify sexual partners for testing and treatment. Patient verbalized understanding and has no further questions.   Patient is interested in [place]. Advised him he can discuss this medication switch at his upcming appaointment with Dr. Daiva Eves.   Sandie Ano, RN

## 2020-11-29 NOTE — Telephone Encounter (Signed)
Transition Care Management Unsuccessful Follow-up Telephone Call  Date of discharge and from where:  11/28/2020 from [facility]  Attempts:  1st Attempt  Reason for unsuccessful TCM follow-up call:  Unable to leave message

## 2020-11-29 NOTE — Telephone Encounter (Signed)
Attempted to call patient to schedule next two doses of Bicillin per Dr. Elinor Parkinson, no answer. Left HIPAA compliant voicemail requesting callback.   Sandie Ano, RN

## 2020-11-29 NOTE — Telephone Encounter (Signed)
She needs  Benzathin penicillin remaining 2 doses, once weekly.

## 2020-11-29 NOTE — Telephone Encounter (Signed)
Post ED Visit - Positive Culture Follow-up  Culture report reviewed by antimicrobial stewardship pharmacist: [facility] []  , Pharm.D. []  Enzo Bi, Pharm.D., BCPS AQ-ID []  , Pharm.D., BCPS []  Celedonio Miyamoto, .D., BCPS []  [place], .D., BCPS, AAHIVP []  Georgina Pillion, Pharm.D., BCPS, AAHIVP []  [address], PharmD, BCPS []  , PharmD, BCPS []  [place], PharmD, BCPS []  [address], PharmD []  , PharmD, BCPS []  Estella Husk, PharmD  Pharmacy Team []  Lysle Pearl, PharmD []  , PharmD []  Phillips Climes, PharmD []  , Rph []  Agapito Games) , PharmD []  Verlan Friends, PharmD []  , PharmD []  Mervyn Gay, PharmD []  , PharmD []  Vinnie Level, PharmD []  [facility], PharmD []  , PharmD []  Len Childs, PharmD   Positive urine culture Treated with Doxycycline, organism sensitive to the same and no further patient follow-up is required at this time.  , PharmD  Greer Pickerel Talley 11/29/2020, 8:48 AM

## 2020-11-29 NOTE — Telephone Encounter (Signed)
Benzathine Penicillin 2.4 million units IM weekly 2 doses.

## 2020-11-29 NOTE — Telephone Encounter (Signed)
-----   Message from Odette Fraction, MD sent at 11/28/2020  5:27 PM EST ----- Regarding: syphilis He is tested positive for syphilis. He is going to get 1 dose of penicillin in the ED today. Please arrange for 2 more doses, once weekly for late latent syphilis. Thanks

## 2020-11-30 NOTE — Telephone Encounter (Signed)
Transition Care Management Unsuccessful Follow-up Telephone Call  Date of discharge and from where:  11/28/2020 from [facility]  Attempts:  2nd Attempt  Reason for unsuccessful TCM follow-up call:  Left voice message

## 2020-12-05 ENCOUNTER — Other Ambulatory Visit: Payer: Self-pay

## 2020-12-05 ENCOUNTER — Ambulatory Visit: Payer: Self-pay

## 2020-12-06 ENCOUNTER — Other Ambulatory Visit: Payer: Self-pay

## 2020-12-06 ENCOUNTER — Ambulatory Visit: Payer: Self-pay

## 2020-12-06 ENCOUNTER — Telehealth: Payer: Self-pay

## 2020-12-06 NOTE — Telephone Encounter (Addendum)
Called patient in regards to his appointment for Bicillin at 2 o'clock. Left message requesting patient call back to confirm that he will be in today before 5 for injection or if he needs to reschedule. First dose given on 2/16 at ED. Will need second dose before 2/26.

## 2020-12-12 ENCOUNTER — Telehealth: Payer: Self-pay

## 2020-12-12 ENCOUNTER — Other Ambulatory Visit: Payer: Self-pay

## 2020-12-12 ENCOUNTER — Ambulatory Visit (INDEPENDENT_AMBULATORY_CARE_PROVIDER_SITE_OTHER): Payer: Self-pay

## 2020-12-12 ENCOUNTER — Encounter: Payer: Self-pay | Admitting: Infectious Disease

## 2020-12-12 ENCOUNTER — Ambulatory Visit: Payer: Self-pay

## 2020-12-12 DIAGNOSIS — B20 Human immunodeficiency virus [HIV] disease: Secondary | ICD-10-CM

## 2020-12-12 DIAGNOSIS — Z79899 Other long term (current) drug therapy: Secondary | ICD-10-CM

## 2020-12-12 DIAGNOSIS — A539 Syphilis, unspecified: Secondary | ICD-10-CM

## 2020-12-12 DIAGNOSIS — Z113 Encounter for screening for infections with a predominantly sexual mode of transmission: Secondary | ICD-10-CM

## 2020-12-12 MED ORDER — PENICILLIN G BENZATHINE 1200000 UNIT/2ML IM SUSP
1.2000 10*6.[IU] | Freq: Once | INTRAMUSCULAR | Status: AC
  Administered 2020-12-12: 1.2 10*6.[IU] via INTRAMUSCULAR

## 2020-12-12 NOTE — Telephone Encounter (Signed)
Patient here for Bicillin injection. He received 2.4 million units in the emergency department on 11/28/20. Patient no-showed for second Bicillin injection, he needed to have second injections by 12/08/20. Per Dr. Earlene Plater, patient will need to restart Bicillin series.   Sandie Ano, RN

## 2020-12-12 NOTE — Telephone Encounter (Signed)
Attempted to call patient to ensure he will be coming in today for his nurse visit. Left voicemail requesting call back. Will forward patient information to DIS if he does not come in today.  Will send mychart message.

## 2020-12-12 NOTE — Telephone Encounter (Signed)
Pt restarted Bicillin series today due to prolonged miss between 1st and 2nd dose.

## 2020-12-12 NOTE — Telephone Encounter (Signed)
Patient here to restart Bicillin injections. Was recently seen in the emergency department for epididymitis. He states he is still having testicular pain, it has been going on for about 2.5 weeks. He states he never picked up the doxycycline that was prescribed by the emergency department. He is requesting refill of Vicodin or some other pain medication. He states the pain is 5-6/10 and feels like "tightness." Will route to provider.   Sandie Ano, RN

## 2020-12-12 NOTE — Progress Notes (Signed)
Reviewed and verified allergies with patient. Patient tolerated Bicillin injections well. Reinforced abstinence until treatment completed plus an additional 10 days, offered condoms and encouraged use. Advised patient to notify sexual partners for testing and treatment. Patient verbalized understanding.   Patient received first Bicillin injections in the emergency department 11/28/20. Patient missed appointment for second injection. Per Dr. Earlene Plater, patient will need to restart 2.4 million units Bicillin IM weekly x 3. First injection of series given today. Patient scheduled for next two injections, RN emphasized importance of keeping those appointments to avoid restarting again. Patient verbalized understanding and has no further questions.    Sandie Ano, RN

## 2020-12-12 NOTE — Telephone Encounter (Signed)
Patient here to restart Bicillin series. When RN explained that he would have to restart 3 week series he stated that he had "a lot going on" and wasn't sure he'd be able to make 3 weekly appointments. RN spoke with Dr. Daiva Eves who suggested 28 days of treatment with doxycycline. RN offered this to the patient as an option. He states that taking pills for 28 days is "too much" and that he would rather do the weekly injections. RN emphasized importance of keeping injection appointments to avoid restarting treatment again. Patient verbalized understanding and is agreeable to restart Bicillin series. RN confirmed orders of 2.4 million units Bicillin IM weekly x 3 with Dr. Earlene Plater.   Sandie Ano, RN

## 2020-12-12 NOTE — Telephone Encounter (Signed)
No worries thaks Aundra Millet

## 2020-12-13 LAB — T-HELPER CELL (CD4) - (RCID CLINIC ONLY)
CD4 % Helper T Cell: 18 % — ABNORMAL LOW (ref 33–65)
CD4 T Cell Abs: 334 /uL — ABNORMAL LOW (ref 400–1790)

## 2020-12-14 LAB — COMPREHENSIVE METABOLIC PANEL
AG Ratio: 0.5 (calc) — ABNORMAL LOW (ref 1.0–2.5)
ALT: 20 U/L (ref 9–46)
AST: 22 U/L (ref 10–40)
Albumin: 3.5 g/dL — ABNORMAL LOW (ref 3.6–5.1)
Alkaline phosphatase (APISO): 64 U/L (ref 36–130)
BUN: 12 mg/dL (ref 7–25)
CO2: 27 mmol/L (ref 20–32)
Calcium: 9.3 mg/dL (ref 8.6–10.3)
Chloride: 104 mmol/L (ref 98–110)
Creat: 1.05 mg/dL (ref 0.60–1.35)
Globulin: 6.4 g/dL (calc) — ABNORMAL HIGH (ref 1.9–3.7)
Glucose, Bld: 90 mg/dL (ref 65–99)
Potassium: 4.3 mmol/L (ref 3.5–5.3)
Sodium: 135 mmol/L (ref 135–146)
Total Bilirubin: 0.6 mg/dL (ref 0.2–1.2)
Total Protein: 9.9 g/dL — ABNORMAL HIGH (ref 6.1–8.1)

## 2020-12-14 LAB — CBC WITH DIFFERENTIAL/PLATELET
Absolute Monocytes: 372 cells/uL (ref 200–950)
Basophils Absolute: 31 cells/uL (ref 0–200)
Basophils Relative: 0.6 %
Eosinophils Absolute: 82 cells/uL (ref 15–500)
Eosinophils Relative: 1.6 %
HCT: 40.3 % (ref 38.5–50.0)
Hemoglobin: 13.6 g/dL (ref 13.2–17.1)
Lymphs Abs: 2030 cells/uL (ref 850–3900)
MCH: 26.6 pg — ABNORMAL LOW (ref 27.0–33.0)
MCHC: 33.7 g/dL (ref 32.0–36.0)
MCV: 78.9 fL — ABNORMAL LOW (ref 80.0–100.0)
MPV: 10.9 fL (ref 7.5–12.5)
Monocytes Relative: 7.3 %
Neutro Abs: 2586 cells/uL (ref 1500–7800)
Neutrophils Relative %: 50.7 %
Platelets: 325 10*3/uL (ref 140–400)
RBC: 5.11 10*6/uL (ref 4.20–5.80)
RDW: 14.9 % (ref 11.0–15.0)
Total Lymphocyte: 39.8 %
WBC: 5.1 10*3/uL (ref 3.8–10.8)

## 2020-12-14 LAB — LIPID PANEL
Cholesterol: 146 mg/dL (ref <200)
HDL: 63 mg/dL (ref >=40)
LDL Cholesterol (Calc): 71 mg/dL (calc)
Non-HDL Cholesterol (Calc): 83 mg/dL (calc) (ref <130)
Total CHOL/HDL Ratio: 2.3 (calc) (ref <5.0)
Triglycerides: 41 mg/dL (ref <150)

## 2020-12-14 LAB — FLUORESCENT TREPONEMAL AB(FTA)-IGG-BLD: Fluorescent Treponemal ABS: REACTIVE — AB

## 2020-12-14 LAB — RPR TITER: RPR Titer: 1:16 {titer} — ABNORMAL HIGH

## 2020-12-14 LAB — RPR: RPR Ser Ql: REACTIVE — AB

## 2020-12-14 LAB — HIV-1 RNA QUANT-NO REFLEX-BLD
HIV 1 RNA Quant: 3420 Copies/mL — ABNORMAL HIGH
HIV-1 RNA Quant, Log: 3.53 Log cps/mL — ABNORMAL HIGH

## 2020-12-19 ENCOUNTER — Other Ambulatory Visit: Payer: Self-pay

## 2020-12-19 ENCOUNTER — Ambulatory Visit (INDEPENDENT_AMBULATORY_CARE_PROVIDER_SITE_OTHER): Payer: Self-pay

## 2020-12-19 DIAGNOSIS — A539 Syphilis, unspecified: Secondary | ICD-10-CM

## 2020-12-19 MED ORDER — PENICILLIN G BENZATHINE 1200000 UNIT/2ML IM SUSP
1.2000 10*6.[IU] | Freq: Once | INTRAMUSCULAR | Status: AC
  Administered 2020-12-19: 1.2 10*6.[IU] via INTRAMUSCULAR

## 2020-12-19 NOTE — Progress Notes (Signed)
Patient came in office today for 2nd dose of bicillin.  Patient reported he tolerated first dose well and no issues. Patient tolerated 2nd dose well.  Travis Herring T Pricilla Loveless

## 2020-12-26 ENCOUNTER — Other Ambulatory Visit: Payer: Self-pay

## 2020-12-26 ENCOUNTER — Ambulatory Visit (INDEPENDENT_AMBULATORY_CARE_PROVIDER_SITE_OTHER): Payer: Self-pay | Admitting: Infectious Disease

## 2020-12-26 ENCOUNTER — Telehealth: Payer: Self-pay

## 2020-12-26 ENCOUNTER — Encounter: Payer: Self-pay | Admitting: Infectious Disease

## 2020-12-26 VITALS — BP 157/100 | HR 116 | Temp 98.3°F | Wt 153.0 lb

## 2020-12-26 DIAGNOSIS — N50811 Right testicular pain: Secondary | ICD-10-CM

## 2020-12-26 DIAGNOSIS — N50812 Left testicular pain: Secondary | ICD-10-CM

## 2020-12-26 DIAGNOSIS — B20 Human immunodeficiency virus [HIV] disease: Secondary | ICD-10-CM

## 2020-12-26 DIAGNOSIS — A539 Syphilis, unspecified: Secondary | ICD-10-CM

## 2020-12-26 DIAGNOSIS — N50819 Testicular pain, unspecified: Secondary | ICD-10-CM

## 2020-12-26 HISTORY — DX: Testicular pain, unspecified: N50.819

## 2020-12-26 MED ORDER — PENICILLIN G BENZATHINE 1200000 UNIT/2ML IM SUSP
1.2000 10*6.[IU] | Freq: Once | INTRAMUSCULAR | Status: AC
  Administered 2020-12-26: 1.2 10*6.[IU] via INTRAMUSCULAR

## 2020-12-26 NOTE — Progress Notes (Signed)
Subjective:  Chief complaint having scrotal pain left side rather than right   Patient ID: Travis Herring, male    DOB: [birth year], 35 y.o.   MRN: [id number]  HPI  36 year old [place] American man who was previously in care in [place] and followed by me having been most recently on Compl[facility] with me. He transferred care to [place] and was on STRIBILD for some time. He was visiting his grandmother in [place] who ultimately died from heart attack.   He had been on BIKTARVY recently but yet again ran out of his medications.  Fortunately CD4 count is only dropped to 334 viral load was not especially high but it has been as high as 500,000 has been off meds before.  He is coming in filled out paper at work for his HIV medication assistance plan but is out of medications.  Provided him with [facility] bottles of 7 pills of BIKTARVY which were placed into 1 container with his late name on it.  Hopefully HIV medication plan will kick in soon  He has had significant scrotal pain which is him to go to the emergency department where he was worked up with a GC chlamydia test and urine which was negative urine analysis and culture which was negative scrotal ultrasound which was negative syphilis titer which was positive given 1 shot of penicillin is now had getting his third shot today for syphilis.  He says the swelling has gone down but he still has some persistent pain in the left testicle.   He inquired about Cabenuva.    Past Medical History:  Diagnosis Date  . Asthma   . Cocaine use 09/20/2019  . HIV disease (HCC)   . Homelessness 12/12/2019  . Testicular pain 12/26/2020    Past Surgical History:  Procedure Laterality Date  . APPENDECTOMY      Family History  Problem Relation Age of Onset  . Hypertension Mother   . Hypertension Sister   . Diabetes Maternal Grandmother   . Heart disease Maternal Grandmother   . Hypertension Maternal Grandmother   . Cancer Maternal Grandfather        colon   . Aneurysm Maternal Grandfather   . Heart disease Paternal Grandmother   . Hypertension Sister       Social History   Socioeconomic History  . Marital status: Single    Spouse name: Not on file  . Number of children: Not on file  . Years of education: Not on file  . Highest education level: Not on file  Occupational History  . Not on file  Tobacco Use  . Smoking status: Current Some Day Smoker    Packs/day: 0.25    Types: Cigarettes, Cigars    Last attempt to quit: 10/13/2013    Years since quitting: 7.2  . Smokeless tobacco: Never Used  . Tobacco comment: black and milds; 3 per day  Substance and Sexual Activity  . Alcohol use: Yes    Alcohol/week: 1.0 standard drink    Types: 1 Cans of beer per week    Comment: occassionally   . Drug use: Yes    Frequency: 7.0 times per week    Types: Marijuana    Comment: 1-3 blunts daily; cocaine occassionally   . Sexual activity: Not Currently    Partners: Female, Male    Comment: given ondoms  Other Topics Concern  . Not on file  Social History Narrative  . Not on file   Social Determinants  of Health   Financial Resource Strain: Not on file  Food Insecurity: Not on file  Transportation Needs: Not on file  Physical Activity: Not on file  Stress: Not on file  Social Connections: Not on file    Allergies  Allergen Reactions  . Aspirin Nausea Only     Current Outpatient Medications:  .  azithromycin (ZITHROMAX) 250 MG tablet, Take 2 po the first day then once a day for the next 4 days. (Patient not taking: Reported on 07/25/2020), Disp: 6 tablet, Rfl: 0 .  bictegravir-emtricitabine-tenofovir AF (BIKTARVY) 50-200-25 MG TABS tablet, Take 1 tablet by mouth daily. (Patient not taking: Reported on 12/26/2020), Disp: 30 tablet, Rfl: 11 .  cephALEXin (KEFLEX) 500 MG capsule, Take 1 capsule (500 mg total) by mouth 2 (two) times daily., Disp: 14 capsule, Rfl: 0 .  dicyclomine (BENTYL) 20 MG tablet, Take 1 tablet (20 mg total) by  mouth 2 (two) times daily., Disp: 10 tablet, Rfl: 0 .  Fluticasone-Salmeterol (ADVAIR DISKUS) 100-50 MCG/DOSE AEPB, Inhale 1 puff into the lungs 2 (two) times daily. (Patient taking differently: Inhale 1 puff into the lungs daily as needed (sob). ), Disp: 60 each, Rfl: 11 .  HYDROcodone-acetaminophen (NORCO/VICODIN) 5-325 MG tablet, Take 1 tablet by mouth every 6 (six) hours as needed for moderate pain. (Patient not taking: Reported on 12/26/2020), Disp: 20 tablet, Rfl: 0 .  ondansetron (ZOFRAN ODT) 4 MG disintegrating tablet, Take 1 tablet (4 mg total) by mouth every 8 (eight) hours as needed for nausea or vomiting. (Patient not taking: Reported on 03/06/2020), Disp: 20 tablet, Rfl: 0 .  predniSONE (DELTASONE) 20 MG tablet, Take 3 po QD x 3d , then 2 po QD x 3d then 1 po QD x 3d (Patient not taking: Reported on 07/25/2020), Disp: 18 tablet, Rfl: 0 .  sulfamethoxazole-trimethoprim (BACTRIM DS) 800-160 MG tablet, Take 1 tablet by mouth 2 (two) times daily. (Patient not taking: Reported on 07/25/2020), Disp: 20 tablet, Rfl: 0   Testicular pain and swelling  otherwse 12 pt ros is negative      Objective:   Physical Exam Constitutional:      General: He is not in acute distress.    Appearance: Normal appearance. He is well-developed. He is not ill-appearing or diaphoretic.  HENT:     Head: Normocephalic and atraumatic.     Right Ear: Hearing and external ear normal.     Left Ear: Hearing and external ear normal.     Nose: No nasal deformity or rhinorrhea.  Eyes:     General: No scleral icterus.    Extraocular Movements: Extraocular movements intact.     Conjunctiva/sclera: Conjunctivae normal.     Right eye: Right conjunctiva is not injected.     Left eye: Left conjunctiva is not injected.  Neck:     Vascular: No JVD.  Cardiovascular:     Rate and Rhythm: Normal rate and regular rhythm.     Heart sounds: S1 normal and S2 normal.  Pulmonary:     Effort: Pulmonary effort is normal. No  respiratory distress.     Breath sounds: No wheezing.  Abdominal:     General: There is no distension.     Palpations: Abdomen is soft.  Musculoskeletal:        General: Normal range of motion.     Right shoulder: Normal.     Left shoulder: Normal.     Cervical back: Normal range of motion and neck supple.  Right hip: Normal.     Left hip: Normal.     Right knee: Normal.     Left knee: Normal.  Lymphadenopathy:     Head:     Right side of head: No submandibular, preauricular or posterior auricular adenopathy.     Left side of head: No submandibular, preauricular or posterior auricular adenopathy.     Cervical: No cervical adenopathy.     Right cervical: No superficial or deep cervical adenopathy.    Left cervical: No superficial or deep cervical adenopathy.  Skin:    General: Skin is warm and dry.     Coloration: Skin is not pale.     Findings: No abrasion, bruising, ecchymosis, erythema, lesion or rash.     Nails: There is no clubbing.  Neurological:     General: No focal deficit present.     Mental Status: He is alert and oriented to person, place, and time. Mental status is at baseline.     Sensory: No sensory deficit.     Coordination: Coordination normal.     Gait: Gait normal.  Psychiatric:        Attention and Perception: He is attentive.        Mood and Affect: Mood is    Speech: Speech normal.        Behavior: Behavior normal. Behavior is cooperative.        Thought Content: Thought content normal.        Judgment: Judgment normal.    Left testicle is somewhat tender not terribly swollen       Assessment & Plan:   HIV/AIDS: Gave him 4 bottles of Biktarvy.  HMA P should kick in soon Recheck labs in 1 month's time and see him in 6 weeks.  I cautioned him about the risk of failure with Cabenuva and looking through his history of haphazard adherence I do not think he would be a good candidate fort his drug   Asthma: advair and use pharma assistance from  [employer]  Syphilis: sp PCN x 3  Scrotal pain: monitor and consider treat for prostatitis if persists but I expect willr esolve  I spent greater than 40 minutes with the patient including greater than 50% of time in face to face counsel of the patient his labs nature of different antiretroviral regimens need for adherence and in coordination of his care.

## 2020-12-26 NOTE — Telephone Encounter (Signed)
[facility] Patient Product/process development scientist completed.    The patient is uninsured and will need patient assistance for medication.  Gave Patient 4 bottles (28 day Supply) of Biktarvy samples until ADAP is approved.  We can complete the application and will need to meet with the patient for signatures and income documentation.  Clearance Coots, CPhT Specialty Pharmacy Patient [facility] Phone: [phone] Fax:  [phone]

## 2020-12-28 ENCOUNTER — Encounter: Payer: Self-pay | Admitting: Infectious Disease

## 2021-02-05 ENCOUNTER — Other Ambulatory Visit: Payer: Self-pay

## 2021-03-06 ENCOUNTER — Ambulatory Visit: Payer: Self-pay | Admitting: Infectious Disease

## 2021-05-01 ENCOUNTER — Telehealth: Payer: Self-pay

## 2021-05-01 NOTE — Telephone Encounter (Signed)
Line busy, tried calling patient after a request for an appointment with Dr. Daiva Eves for refills on medication, needs to see a therapist and requesting rehab. Was going to offer an appointment with Dr. Renold Don on 7/22 for a 15 minute appointment to discuss patients request, last appointment was in march of 2022 and needs a follow up. Will try again later.

## 2021-05-21 ENCOUNTER — Ambulatory Visit: Payer: Self-pay | Admitting: Infectious Disease

## 2021-05-27 ENCOUNTER — Other Ambulatory Visit: Payer: Self-pay

## 2021-05-27 ENCOUNTER — Telehealth: Payer: Self-pay

## 2021-05-27 DIAGNOSIS — Z113 Encounter for screening for infections with a predominantly sexual mode of transmission: Secondary | ICD-10-CM

## 2021-05-27 DIAGNOSIS — B20 Human immunodeficiency virus [HIV] disease: Secondary | ICD-10-CM

## 2021-05-27 NOTE — Telephone Encounter (Signed)
Patient sent message to reschedule missed appointment with Dr. Daiva Eves and to requesting refills on medication. Patient is rescheduled to see MD later this month with labs this week. Requested patient to call office to discuss medication refill. Per [facility] patient has not filled medication through them since 11/2019. Will need to confirm patient has been taking medication correctly. Per last note patient received months worth of supplies during last visit. Juanita Laster, RMA

## 2021-05-28 ENCOUNTER — Other Ambulatory Visit: Payer: Self-pay

## 2021-05-29 ENCOUNTER — Other Ambulatory Visit: Payer: Self-pay

## 2021-05-30 ENCOUNTER — Other Ambulatory Visit: Payer: Self-pay

## 2021-06-11 ENCOUNTER — Ambulatory Visit: Payer: Self-pay | Admitting: Infectious Disease

## 2021-07-05 ENCOUNTER — Telehealth: Payer: Self-pay

## 2021-07-05 NOTE — Telephone Encounter (Signed)
Called patient to schedule overdue appointment, no answer. Left HIPAA compliant voicemail requesting callback.   Astella Desir D Enslee Bibbins, RN  

## 2021-10-12 ENCOUNTER — Other Ambulatory Visit: Payer: Self-pay

## 2021-10-12 ENCOUNTER — Emergency Department (HOSPITAL_COMMUNITY): Payer: Self-pay

## 2021-10-12 ENCOUNTER — Emergency Department (HOSPITAL_COMMUNITY)
Admission: EM | Admit: 2021-10-12 | Discharge: 2021-10-12 | Disposition: A | Discharge status: Home / Self Care | Payer: Self-pay | Attending: Emergency Medicine | Admitting: Emergency Medicine

## 2021-10-12 ENCOUNTER — Encounter (HOSPITAL_COMMUNITY): Payer: Self-pay | Admitting: Emergency Medicine

## 2021-10-12 DIAGNOSIS — Z23 Encounter for immunization: Secondary | ICD-10-CM | POA: Insufficient documentation

## 2021-10-12 DIAGNOSIS — S80811A Abrasion, right lower leg, initial encounter: Secondary | ICD-10-CM | POA: Insufficient documentation

## 2021-10-12 DIAGNOSIS — S53409A Unspecified sprain of unspecified elbow, initial encounter: Secondary | ICD-10-CM

## 2021-10-12 DIAGNOSIS — F17210 Nicotine dependence, cigarettes, uncomplicated: Secondary | ICD-10-CM | POA: Insufficient documentation

## 2021-10-12 DIAGNOSIS — J45909 Unspecified asthma, uncomplicated: Secondary | ICD-10-CM | POA: Insufficient documentation

## 2021-10-12 DIAGNOSIS — S299XXA Unspecified injury of thorax, initial encounter: Secondary | ICD-10-CM | POA: Insufficient documentation

## 2021-10-12 DIAGNOSIS — S060X0A Concussion without loss of consciousness, initial encounter: Secondary | ICD-10-CM

## 2021-10-12 DIAGNOSIS — S0990XA Unspecified injury of head, initial encounter: Secondary | ICD-10-CM

## 2021-10-12 DIAGNOSIS — S0181XA Laceration without foreign body of other part of head, initial encounter: Secondary | ICD-10-CM

## 2021-10-12 DIAGNOSIS — Z21 Asymptomatic human immunodeficiency virus [HIV] infection status: Secondary | ICD-10-CM | POA: Insufficient documentation

## 2021-10-12 DIAGNOSIS — S161XXA Strain of muscle, fascia and tendon at neck level, initial encounter: Secondary | ICD-10-CM

## 2021-10-12 DIAGNOSIS — S53401A Unspecified sprain of right elbow, initial encounter: Secondary | ICD-10-CM | POA: Insufficient documentation

## 2021-10-12 DIAGNOSIS — S53402A Unspecified sprain of left elbow, initial encounter: Secondary | ICD-10-CM | POA: Insufficient documentation

## 2021-10-12 DIAGNOSIS — S0083XA Contusion of other part of head, initial encounter: Secondary | ICD-10-CM

## 2021-10-12 DIAGNOSIS — S298XXA Other specified injuries of thorax, initial encounter: Secondary | ICD-10-CM

## 2021-10-12 MED ORDER — TETANUS-DIPHTH-ACELL PERTUSSIS 5-2.5-18.5 LF-MCG/0.5 IM SUSY
0.5000 mL | PREFILLED_SYRINGE | Freq: Once | INTRAMUSCULAR | Status: AC
  Administered 2021-10-12: 0.5 mL via INTRAMUSCULAR
  Filled 2021-10-12: qty 0.5

## 2021-10-12 MED ORDER — HYDROCODONE-ACETAMINOPHEN 5-325 MG PO TABS: 1.0000 | ORAL_TABLET | Freq: Four times a day (QID) | ORAL | 0 refills | Status: DC | PRN

## 2021-10-12 MED ORDER — MORPHINE SULFATE (PF) 4 MG/ML IV SOLN
4.0000 mg | Freq: Once | INTRAVENOUS | Status: AC
  Administered 2021-10-12: 4 mg via INTRAMUSCULAR
  Filled 2021-10-12: qty 1

## 2021-10-12 MED ORDER — HYDROCODONE-ACETAMINOPHEN 5-325 MG PO TABS
2.0000 | ORAL_TABLET | Freq: Once | ORAL | Status: AC
  Administered 2021-10-12: 2 via ORAL
  Filled 2021-10-12: qty 2
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
## 2021-10-12 NOTE — ED Notes (Signed)
Pt ambulated slowly in hallway. Gait steady. Expressed some lightheadedness likely due to pain medicine but able to continue. Provider informed.

## 2021-11-06 ENCOUNTER — Telehealth: Payer: Self-pay

## 2021-11-06 NOTE — Telephone Encounter (Signed)
Called patient to offer appointment, no answer and unable to leave voicemail. ° ° °Rosalva Neary D Milayah Krell, RN ° °

## 2021-12-06 ENCOUNTER — Emergency Department (HOSPITAL_COMMUNITY)
Admission: EM | Admit: 2021-12-06 | Discharge: 2021-12-06 | Disposition: A | Discharge status: Home / Self Care | Payer: Self-pay | Attending: Emergency Medicine | Admitting: Emergency Medicine

## 2021-12-06 ENCOUNTER — Other Ambulatory Visit: Payer: Self-pay

## 2021-12-06 ENCOUNTER — Emergency Department (HOSPITAL_COMMUNITY): Payer: Self-pay

## 2021-12-06 DIAGNOSIS — R112 Nausea with vomiting, unspecified: Secondary | ICD-10-CM | POA: Insufficient documentation

## 2021-12-06 DIAGNOSIS — Z20822 Contact with and (suspected) exposure to covid-19: Secondary | ICD-10-CM | POA: Insufficient documentation

## 2021-12-06 DIAGNOSIS — R531 Weakness: Secondary | ICD-10-CM

## 2021-12-06 DIAGNOSIS — M6281 Muscle weakness (generalized): Secondary | ICD-10-CM | POA: Insufficient documentation

## 2021-12-06 LAB — CBC WITH DIFFERENTIAL/PLATELET
Abs Immature Granulocytes: 0.01 10*3/uL (ref 0.00–0.07)
Basophils Absolute: 0 10*3/uL (ref 0.0–0.1)
Basophils Relative: 0 %
Eosinophils Absolute: 0 10*3/uL (ref 0.0–0.5)
Eosinophils Relative: 1 %
HCT: 45.9 % (ref 39.0–52.0)
Hemoglobin: 15.1 g/dL (ref 13.0–17.0)
Immature Granulocytes: 0 %
Lymphocytes Relative: 38 %
Lymphs Abs: 1.9 10*3/uL (ref 0.7–4.0)
MCH: 28.3 pg (ref 26.0–34.0)
MCHC: 32.9 g/dL (ref 30.0–36.0)
MCV: 86.1 fL (ref 80.0–100.0)
Monocytes Absolute: 0.4 10*3/uL (ref 0.1–1.0)
Monocytes Relative: 8 %
Neutro Abs: 2.7 10*3/uL (ref 1.7–7.7)
Neutrophils Relative %: 53 %
Platelets: 210 10*3/uL (ref 150–400)
RBC: 5.33 MIL/uL (ref 4.22–5.81)
RDW: 13.2 % (ref 11.5–15.5)
WBC: 5 10*3/uL (ref 4.0–10.5)
nRBC: 0 % (ref 0.0–0.2)

## 2021-12-06 LAB — COMPREHENSIVE METABOLIC PANEL
ALT: 33 U/L (ref 0–44)
AST: 47 U/L — ABNORMAL HIGH (ref 15–41)
Albumin: 3.3 g/dL — ABNORMAL LOW (ref 3.5–5.0)
Alkaline Phosphatase: 73 U/L (ref 38–126)
Anion gap: 8 (ref 5–15)
BUN: 7 mg/dL (ref 6–20)
CO2: 24 mmol/L (ref 22–32)
Calcium: 9.2 mg/dL (ref 8.9–10.3)
Chloride: 103 mmol/L (ref 98–111)
Creatinine, Ser: 0.92 mg/dL (ref 0.61–1.24)
GFR, Estimated: >60 mL/min (ref >60)
Glucose, Bld: 99 mg/dL (ref 70–99)
Potassium: 3.7 mmol/L (ref 3.5–5.1)
Sodium: 135 mmol/L (ref 135–145)
Total Bilirubin: 0.8 mg/dL (ref 0.3–1.2)
Total Protein: 9.8 g/dL — ABNORMAL HIGH (ref 6.5–8.1)

## 2021-12-06 LAB — RESP PANEL BY RT-PCR (FLU A&B, COVID) ARPGX2
Influenza A by PCR: NEGATIVE
Influenza B by PCR: NEGATIVE
SARS Coronavirus 2 by RT PCR: NEGATIVE

## 2021-12-06 MED ORDER — LACTATED RINGERS IV BOLUS
1000.0000 mL | Freq: Once | INTRAVENOUS | Status: AC
  Administered 2021-12-06: 1000 mL via INTRAVENOUS

## 2021-12-06 MED ORDER — ONDANSETRON HCL 4 MG/2ML IJ SOLN
4.0000 mg | Freq: Once | INTRAMUSCULAR | Status: AC
  Administered 2021-12-06: 4 mg via INTRAVENOUS
  Filled 2021-12-06: qty 2

## 2021-12-06 NOTE — ED Provider Notes (Signed)
[facility] EMERGENCY DEPARTMENT  Provider Note  CSN: [id number] Arrival date & time: 12/06/21 1157  History Chief Complaint  Patient presents with   Weakness    Travis Herring is a 36 y.o. male who presents today with complaints of fatigue, diarrhea, and night sweats, body aches.  These been present for the last 3 days.  He states that he was recently exposed to [place].  He is concerned that he may have contracted COVID or other viral illness again.  He has had 3 visits of diarrhea today.  Is an HIV patient and has not been taking his antiretrovirals.   Home Medications Prior to Admission medications   Medication Sig Start Date End Date Taking? Authorizing Provider  bictegravir-emtricitabine-tenofovir AF (BIKTARVY) 50-200-25 MG TABS tablet Take 1 tablet by mouth daily. Patient not taking: Reported on 12/26/2020 12/12/19   Tommy Medal, Lavell Islam, MD  dicyclomine (BENTYL) 20 MG tablet Take 1 tablet (20 mg total) by mouth 2 (two) times daily. 03/06/20   Khatri, Hina, PA-C  Fluticasone-Salmeterol (ADVAIR DISKUS) 100-50 MCG/DOSE AEPB Inhale 1 puff into the lungs 2 (two) times daily. Patient taking differently: Inhale 1 puff into the lungs daily as needed (sob).  09/20/19   Truman Hayward, MD  HYDROcodone-acetaminophen (NORCO/VICODIN) 5-325 MG tablet Take 1 tablet by mouth every 6 (six) hours as needed for severe pain. 10/12/21   Ripley Fraise, MD  sulfamethoxazole-trimethoprim (BACTRIM DS) 800-160 MG tablet Take 1 tablet by mouth 2 (two) times daily. Patient not taking: Reported on 07/25/2020 05/13/20   Rolland Porter, MD     Allergies    Aspirin   Review of Systems   Review of Systems  Constitutional:  Positive for fatigue. Negative for chills and fever.  HENT:  Negative for ear pain and sore throat.   Eyes:  Negative for pain and visual disturbance.  Respiratory:  Negative for cough and shortness of breath.   Cardiovascular:  Negative for chest pain and palpitations.   Gastrointestinal:  Positive for abdominal pain, diarrhea, nausea and vomiting.  Genitourinary:  Negative for dysuria and hematuria.  Musculoskeletal:  Negative for arthralgias and back pain.  Skin:  Negative for color change and rash.  Neurological:  Negative for seizures and syncope.  All other systems reviewed and are negative. Please see HPI for pertinent positives and negatives  Physical Exam BP (!) 150/108    Pulse 74    Temp 98.6 F (37 C) (Oral)    Resp 18    SpO2 100%   Physical Exam Vitals and nursing note reviewed.  Constitutional:      General: He is not in acute distress.    Appearance: He is well-developed.  HENT:     Head: Normocephalic and atraumatic.  Eyes:     Conjunctiva/sclera: Conjunctivae normal.  Cardiovascular:     Rate and Rhythm: Normal rate and regular rhythm.     Heart sounds: No murmur heard. Pulmonary:     Effort: Pulmonary effort is normal. No respiratory distress.     Breath sounds: Normal breath sounds.  Abdominal:     Palpations: Abdomen is soft.     Tenderness: There is no abdominal tenderness.  Musculoskeletal:        General: No swelling.     Cervical back: Neck supple.  Skin:    General: Skin is warm and dry.     Capillary Refill: Capillary refill takes less than 2 seconds.  Neurological:     Mental  Status: He is alert.  Psychiatric:        Mood and Affect: Mood normal.    ED Results / Procedures / Treatments   EKG None  Procedures Procedures  Medications Ordered in the ED Medications  lactated ringers bolus 1,000 mL (1,000 mLs Intravenous New Bag/Given 12/06/21 1443)  ondansetron (ZOFRAN) injection 4 mg (4 mg Intravenous Given 12/06/21 1442)     ED Course       MDM   This patient presents to the ED for concern of weakness, this involves an extensive number of treatment options, and is a complaint that carries with it a high risk of complications and morbidity.  The differential diagnosis includes viral illness,  bacterial illness. Patients presentation is complicated by their history of HIV with noncompliance with medications  Additional history obtained: Records reviewed Primary Care Documents  Lab Tests: I Ordered, and personally interpreted labs.  The pertinent results include: Stable white count from previous.  No metabolic abnormalities.  Imaging Studies ordered: I ordered imaging studies including X-ray chest   I independently visualized and interpreted imaging which showed no acute abnormalities I agree with the radiologist interpretation  Medical Decision Making: Patient is a 36 year old male who presents today with weakness and vomiting.  Patient was recently exposed to coworker with a viral illness.  Since then he has had diarrhea and vomiting.  His vital signs are stable and within normal limits here.  He was mildly hypertensive but no hypotension, tachycardia, hypoxia.  He is ambulatory here in the ED.  He is tolerated p.o.  His work-up was grossly unremarkable.  No leukocytosis.  No findings consistent with bacterial pneumonia.  Patient likely a viral illness given his recent sick contacts.  He does have history of HIV and is noncompliant with medications.  However, there is no findings consistent with sinus infection requiring antibiotics or admission at this time.  However we did discuss that he needs to restart his HIV therapy.  We discussed he needs to follow back up with his primary care doctor.  He voiced understanding and agreed with this plan.  We discussed the importance of close outpatient follow.  We discussed that he needs to return the emergency room with any worsening symptoms including fever, shortness of breath, bloody diarrhea.  He voiced understanding and agreement with this plan.  Complexity of problems addressed: Patients presentation is most consistent with  acute presentation with potential threat to life or bodily function  Disposition: After consideration of the  diagnostic results and the patients response to treatment,  I feel that the patent would benefit from discharge home .   Patient seen in conjunction with my attending, Dr. Pearline Cables.    Final Clinical Impression(s) / ED Diagnoses Final diagnoses:  Generalized weakness  Nausea and vomiting, unspecified vomiting type    Rx / DC Orders ED Discharge Orders     None         Jacelyn Pi, MD 12/06/21 Henry, [place] A, DO 12/06/21 2102

## 2021-12-06 NOTE — ED Triage Notes (Signed)
Pt here d/t generalized weakness and possible Covid exposure. Not currently taking HIV medications. Denies urinary symptoms.

## 2021-12-06 NOTE — ED Notes (Signed)
Pt verbalized understanding of d/c instructions, meds, and followup care. Denies questions. VSS, no distress noted. Steady gait to exit with all belongings.  ?

## 2021-12-06 NOTE — ED Provider Triage Note (Signed)
Emergency Medicine Provider Triage Evaluation Note  Travis Herring , a 36 y.o. male  was evaluated in triage.  Pt complains of fatigue, diarrhea, night sweats sweats, and generalized body aches.  Symptoms have been present over the last 3 days.  Reports that he was exposed to COVID-19 from a coworker 1 week prior.  Reports 3 episodes of diarrhea since waking today.  Patient has history of HIV.  Has not been on any medications for his HIV over the last 2 months.  Review of Systems  Positive: Fatigue, diarrhea, night sweats, generalized body aches Negative: Fever, chills, cough, abdominal pain, dysuria, hematuria, urinary urgency, blood in stool, melena  Physical Exam  BP (!) 151/112 (BP Location: Right Arm)    Pulse 86    Temp 98.6 F (37 C) (Oral)    Resp 14    SpO2 100%  Gen:   Awake, no distress   Resp:  Normal effort, good auscultation bilaterally MSK:   Moves extremities without difficulty  Other:  Abdomen soft, nondistended, nontender with no guarding or rebound tenderness.  Medical Decision Making  Medically screening exam initiated at 12:50 PM.  Appropriate orders placed.  Travis Herring was informed that the remainder of the evaluation will be completed by another provider, this initial triage assessment does not replace that evaluation, and the importance of remaining in the ED until their evaluation is complete.     Loni Beckwith, [place] 12/06/21 1251

## 2022-03-09 ENCOUNTER — Emergency Department (HOSPITAL_COMMUNITY): Payer: Self-pay

## 2022-03-09 ENCOUNTER — Emergency Department (HOSPITAL_COMMUNITY)
Admission: EM | Admit: 2022-03-09 | Discharge: 2022-03-09 | Disposition: A | Discharge status: Home / Self Care | Payer: Self-pay | Attending: Emergency Medicine | Admitting: Emergency Medicine

## 2022-03-09 DIAGNOSIS — R110 Nausea: Secondary | ICD-10-CM | POA: Insufficient documentation

## 2022-03-09 DIAGNOSIS — N451 Epididymitis: Secondary | ICD-10-CM | POA: Insufficient documentation

## 2022-03-09 DIAGNOSIS — R1032 Left lower quadrant pain: Secondary | ICD-10-CM

## 2022-03-09 LAB — COMPREHENSIVE METABOLIC PANEL
ALT: 14 U/L (ref 0–44)
AST: 22 U/L (ref 15–41)
Albumin: 3.5 g/dL (ref 3.5–5.0)
Alkaline Phosphatase: 71 U/L (ref 38–126)
Anion gap: 5 (ref 5–15)
BUN: 8 mg/dL (ref 6–20)
CO2: 27 mmol/L (ref 22–32)
Calcium: 9.2 mg/dL (ref 8.9–10.3)
Chloride: 104 mmol/L (ref 98–111)
Creatinine, Ser: 0.84 mg/dL (ref 0.61–1.24)
GFR, Estimated: >60 mL/min (ref >60)
Glucose, Bld: 91 mg/dL (ref 70–99)
Potassium: 3.9 mmol/L (ref 3.5–5.1)
Sodium: 136 mmol/L (ref 135–145)
Total Bilirubin: 0.5 mg/dL (ref 0.3–1.2)
Total Protein: 10.2 g/dL — ABNORMAL HIGH (ref 6.5–8.1)

## 2022-03-09 LAB — CBC WITH DIFFERENTIAL/PLATELET
Abs Immature Granulocytes: 0.03 10*3/uL (ref 0.00–0.07)
Basophils Absolute: 0 10*3/uL (ref 0.0–0.1)
Basophils Relative: 0 %
Eosinophils Absolute: 0 10*3/uL (ref 0.0–0.5)
Eosinophils Relative: 0 %
HCT: 42.4 % (ref 39.0–52.0)
Hemoglobin: 14.5 g/dL (ref 13.0–17.0)
Immature Granulocytes: 0 %
Lymphocytes Relative: 19 %
Lymphs Abs: 1.8 10*3/uL (ref 0.7–4.0)
MCH: 30.1 pg (ref 26.0–34.0)
MCHC: 34.2 g/dL (ref 30.0–36.0)
MCV: 88 fL (ref 80.0–100.0)
Monocytes Absolute: 0.6 10*3/uL (ref 0.1–1.0)
Monocytes Relative: 6 %
Neutro Abs: 6.8 10*3/uL (ref 1.7–7.7)
Neutrophils Relative %: 75 %
Platelets: 233 10*3/uL (ref 150–400)
RBC: 4.82 MIL/uL (ref 4.22–5.81)
RDW: 13.8 % (ref 11.5–15.5)
WBC: 9.3 10*3/uL (ref 4.0–10.5)
nRBC: 0 % (ref 0.0–0.2)

## 2022-03-09 LAB — URINALYSIS, ROUTINE W REFLEX MICROSCOPIC
Bilirubin Urine: NEGATIVE
Glucose, UA: NEGATIVE mg/dL
Ketones, ur: NEGATIVE mg/dL
Nitrite: NEGATIVE
Protein, ur: NEGATIVE mg/dL
Specific Gravity, Urine: 1.017 (ref 1.005–1.030)
WBC, UA: >50 WBC/hpf — ABNORMAL HIGH (ref 0–5)
pH: 8 (ref 5.0–8.0)

## 2022-03-09 LAB — LIPASE, BLOOD: Lipase: 32 U/L (ref 11–51)

## 2022-03-09 LAB — LACTIC ACID, PLASMA: Lactic Acid, Venous: 0.9 mmol/L (ref 0.5–1.9)

## 2022-03-09 MED ORDER — MORPHINE SULFATE (PF) 4 MG/ML IV SOLN
4.0000 mg | Freq: Once | INTRAVENOUS | Status: AC
  Administered 2022-03-09: 4 mg via INTRAVENOUS
  Filled 2022-03-09: qty 1

## 2022-03-09 MED ORDER — CEFTRIAXONE SODIUM 1 G IJ SOLR
500.0000 mg | Freq: Once | INTRAMUSCULAR | Status: AC
  Administered 2022-03-09: 500 mg via INTRAMUSCULAR
  Filled 2022-03-09: qty 10

## 2022-03-09 MED ORDER — DOXYCYCLINE HYCLATE 100 MG PO TABS
100.0000 mg | ORAL_TABLET | Freq: Once | ORAL | Status: AC
  Administered 2022-03-09: 100 mg via ORAL
  Filled 2022-03-09: qty 1

## 2022-03-09 MED ORDER — OXYCODONE-ACETAMINOPHEN 5-325 MG PO TABS
1.0000 | ORAL_TABLET | Freq: Once | ORAL | Status: AC
  Administered 2022-03-09: 1 via ORAL
  Filled 2022-03-09: qty 1

## 2022-03-09 MED ORDER — DOXYCYCLINE HYCLATE 100 MG PO CAPS: 100.0000 mg | ORAL_CAPSULE | Freq: Two times a day (BID) | ORAL | 0 refills | Status: DC

## 2022-03-09 MED ORDER — ONDANSETRON HCL 4 MG/2ML IJ SOLN
4.0000 mg | Freq: Once | INTRAMUSCULAR | Status: AC
  Administered 2022-03-09: 4 mg via INTRAVENOUS
  Filled 2022-03-09: qty 2

## 2022-03-09 MED ORDER — OXYCODONE-ACETAMINOPHEN 5-325 MG PO TABS: 1.0000 | ORAL_TABLET | ORAL | 0 refills | Status: DC | PRN

## 2022-03-09 MED ORDER — ONDANSETRON HCL 4 MG PO TABS: 4.0000 mg | ORAL_TABLET | Freq: Three times a day (TID) | ORAL | 0 refills | Status: DC | PRN

## 2022-03-09 NOTE — ED Provider Notes (Signed)
[facility]-EMERGENCY DEPT Provider Note   CSN: [id number] Arrival date & time: 03/09/22  1839     History  Chief Complaint  Patient presents with   Abdominal Pain   Groin Swelling    Travis Herring is a 36 y.o. male.  The history is provided by the patient and medical records. No language interpreter was used.  Abdominal Pain Pain location:  LLQ Pain quality: aching   Pain radiates to:  Scrotum Pain severity:  Severe Onset quality:  Gradual Duration:  2 days Timing:  Constant Progression:  Worsening Chronicity:  Recurrent Context: not trauma   Relieved by:  Nothing Worsened by:  Palpation Ineffective treatments:  None tried Associated symptoms: nausea   Associated symptoms: no anorexia, no chest pain, no chills, no constipation, no cough, no diarrhea, no dysuria, no fatigue, no fever, no flatus, no hematuria, no shortness of breath and no vomiting       Home Medications Prior to Admission medications   Medication Sig Start Date End Date Taking? Authorizing Provider  bictegravir-emtricitabine-tenofovir AF (BIKTARVY) 50-200-25 MG TABS tablet Take 1 tablet by mouth daily. Patient not taking: Reported on 12/26/2020 12/12/19   Daiva Eves, Lisette Grinder, MD  dicyclomine (BENTYL) 20 MG tablet Take 1 tablet (20 mg total) by mouth 2 (two) times daily. 03/06/20   Khatri, Hina, PA-C  Fluticasone-Salmeterol (ADVAIR DISKUS) 100-50 MCG/DOSE AEPB Inhale 1 puff into the lungs 2 (two) times daily. Patient taking differently: Inhale 1 puff into the lungs daily as needed (sob).  09/20/19   Randall Hiss, MD  HYDROcodone-acetaminophen (NORCO/VICODIN) 5-325 MG tablet Take 1 tablet by mouth every 6 (six) hours as needed for severe pain. 10/12/21   Zadie Rhine, MD  sulfamethoxazole-trimethoprim (BACTRIM DS) 800-160 MG tablet Take 1 tablet by mouth 2 (two) times daily. Patient not taking: Reported on 07/25/2020 05/13/20   Devoria Albe, MD      Allergies    Aspirin     Review of Systems   Review of Systems  Constitutional:  Negative for chills, fatigue and fever.  HENT:  Negative for congestion.   Respiratory:  Negative for cough, chest tightness, shortness of breath and wheezing.   Cardiovascular:  Negative for chest pain, palpitations and leg swelling.  Gastrointestinal:  Positive for abdominal pain and nausea. Negative for anorexia, constipation, diarrhea, flatus and vomiting.  Genitourinary:  Positive for scrotal swelling and testicular pain. Negative for decreased urine volume, dysuria, flank pain, hematuria, penile pain and penile swelling.  Musculoskeletal:  Negative for back pain, neck pain and neck stiffness.  Skin:  Negative for rash and wound.  Neurological:  Negative for headaches.  Psychiatric/Behavioral:  Negative for agitation and confusion.   All other systems reviewed and are negative.  Physical Exam Updated Vital Signs BP (!) 145/101 (BP Location: Left Arm)   Pulse 100   Temp 98.5 F (36.9 C) (Oral)   Resp 16   SpO2 100%  Physical Exam Vitals and nursing note reviewed. Exam conducted with a chaperone present.  Constitutional:      General: He is not in acute distress.    Appearance: He is well-developed. He is not ill-appearing, toxic-appearing or diaphoretic.  HENT:     Head: Normocephalic and atraumatic.     Mouth/Throat:     Mouth: Mucous membranes are moist.  Eyes:     General: No scleral icterus.    Conjunctiva/sclera: Conjunctivae normal.  Cardiovascular:     Rate and Rhythm: Normal rate and  regular rhythm.     Heart sounds: No murmur heard. Pulmonary:     Effort: Pulmonary effort is normal. No respiratory distress.     Breath sounds: Normal breath sounds.  Abdominal:     General: Abdomen is flat. Bowel sounds are normal.     Palpations: Abdomen is soft.     Tenderness: There is abdominal tenderness in the left lower quadrant. There is no right CVA tenderness, left CVA tenderness, guarding or rebound.   Genitourinary:    Penis: Normal.      Testes:        Right: Tenderness or swelling not present.        Left: Tenderness and swelling present.  Musculoskeletal:        General: No swelling.     Cervical back: Neck supple.  Skin:    General: Skin is warm and dry.     Capillary Refill: Capillary refill takes less than 2 seconds.     Findings: No rash.  Neurological:     General: No focal deficit present.     Mental Status: He is alert.  Psychiatric:        Mood and Affect: Mood normal.    ED Results / Procedures / Treatments   Labs (all labs ordered are listed, but only abnormal results are displayed) Labs Reviewed  COMPREHENSIVE METABOLIC PANEL - Abnormal; Notable for the following components:      Result Value   Total Protein 10.2 (*)    All other components within normal limits  URINALYSIS, ROUTINE W REFLEX MICROSCOPIC - Abnormal; Notable for the following components:   APPearance HAZY (*)    Hgb urine dipstick SMALL (*)    Leukocytes,Ua LARGE (*)    WBC, UA >50 (*)    Bacteria, UA RARE (*)    All other components within normal limits  URINE CULTURE  CBC WITH DIFFERENTIAL/PLATELET  LACTIC ACID, PLASMA  LIPASE, BLOOD  RPR  LACTIC ACID, PLASMA  GC/CHLAMYDIA PROBE AMP ([place]) NOT AT [facility]    EKG None  Radiology US SCROTUM W/DOPPLER  Result Date: 03/09/2022 CLINICAL DATA:  Left testicular and groin pain for 2 days. EXAM: SCROTAL ULTRASOUND DOPPLER ULTRASOUND OF THE TESTICLES TECHNIQUE: Complete ultrasound examination of the testicles, epididymis, and other scrotal structures was performed. Color and spectral Doppler ultrasound were also utilized to evaluate blood flow to the testicles. COMPARISON:  11/25/2020. FINDINGS: Right testicle Measurements: 4.7 x 2.6 x 2.2 cm. Multiple echogenic foci are noted in the testicle, compatible with microlithiasis. Left testicle Measurements: 4.5 x 2.5 x 2.7 cm. Multiple echogenic foci are noted in the testicle compatible with  microlithiasis. Right epididymis:  Normal in size and appearance. Left epididymis: Epididymal head is enlarged and hypervascular, compatible with epididymitis. Hydrocele:  None visualized. Varicocele:  Small left varicocele. Pulsed Doppler interrogation of both testes demonstrates normal low resistance arterial and venous waveforms bilaterally. IMPRESSION: 1. No evidence of testicular torsion. 2. Enlarged hypervascular epididymis, compatible with epididymitis. 3. Testicular microlithiasis.  Appropriate follow-up is recommended. 4. Small left varicocele. Electronically Signed   By: Thornell Sartorius M.D.   On: 03/09/2022 20:32    Procedures Procedures    Medications Ordered in ED Medications  oxyCODONE-acetaminophen (PERCOCET/ROXICET) 5-325 MG per tablet 1 tablet (has no administration in time range)  cefTRIAXone (ROCEPHIN) injection 500 mg (has no administration in time range)  doxycycline (VIBRA-TABS) tablet 100 mg (has no administration in time range)  morphine (PF) 4 MG/ML injection 4 mg (4 mg Intravenous  Given 03/09/22 2018)  ondansetron [facility]) injection 4 mg (4 mg Intravenous Given 03/09/22 2018)    ED Course/ Medical Decision Making/ A&P                           Medical Decision Making Amount and/or Complexity of Data Reviewed Labs: ordered. Radiology: ordered.  Risk Prescription drug management.    Travis Herring is a 36 y.o. male with a past medical history significant for HIV, previous appendectomy, previous syphilis, and asthma who presents with left groin pain.  He reports that about 2 days ago he had onset of pain in his left lower abdomen going into his left scrotum.  He reports no history of hernias and reports he feels similar to when he had a syphilis in the past.  He reports he has not intercourse in several months.  He denies any dysuria or hematuria and denies history of kidney stones.  He denies any trauma.  He denies any skin changes or rashes.  He describes the pain as 10  out of 10 in severity and has had some associated nausea but no vomiting.  Denies constipation or diarrhea or pain with bowel movements.  Denies any fevers, chills, chest pain, shortness of breath, or cough.  On exam, lungs clear and chest nontender.  Abdomen is tender in the left lower quadrant.  No CVA tenderness or left flank tenderness.  A chaperone was present and his groin was examined.  He has tenderness in the left inguinal area as well as the left hemiscrotum.  The penis itself was not tender nor was the right scrotum.  Unable to get a good cremasteric reflex as his scrotum was retracted bilaterally already.  Rest of exam unremarkable.  No rashes seen.  Given the patient's history of similar symptoms related syphilis I do consider he could have an STI causing the symptoms with an epididymitis or other infection.  However, given the significant amount of pain going into the inguinal area and abdomen, I do feel need to rule out acute torsion.  We will get ultrasound first however if ultrasound is negative, patient may need CT scan to rule out a small hernia.  I was unable to palpate a hernia initially as he just had tenderness and some swelling.  It did not worsen with coughing.  We will get screening labs as well as repeat STI testing with an RPR and gonorrhea/chlamydia testing.  Anticipate empirically treating with antibiotics for possible STI recurrence however we will make sure there is no surgical problem first.  He will receive pain medicine, nausea medicine, and will remain n.p.o. until his ultrasound is completed.  Ultrasound shows no evidence of torsion but does show evidence of epididymitis.  His urinalysis shows leuks and bacteria likely consistent with inflammatory infection.  He does not have any dysuria however, doubt urinary tract infection separately.  Patient's other labs were reassuring.  His RPR and GC/chlamydia test will not return tonight but we will treat empirically for STI  with Rocephin and then start him on doxycycline per the new guidelines.  He was feeling better after pain and nausea medicine, will give prescription for these.  Patient will follow-up with PCP and understood return precautions.  Patient discharged in good condition with improved symptoms.        Final Clinical Impression(s) / ED Diagnoses Final diagnoses:  Epididymitis  Left groin pain    Rx / DC Orders ED Discharge  Orders          Ordered    doxycycline (VIBRAMYCIN) 100 MG capsule  2 times daily        03/09/22 2201    oxyCODONE-acetaminophen (PERCOCET/ROXICET) 5-325 MG tablet  Every 4 hours PRN        03/09/22 2201    ondansetron (ZOFRAN) 4 MG tablet  Every 8 hours PRN        03/09/22 2201           Clinical Impression: 1. Epididymitis   2. Left groin pain     Disposition: Discharge  Condition: Good  I have discussed the results, Dx and Tx plan with the pt(& family if present). He/she/they expressed understanding and agree(s) with the plan. Discharge instructions discussed at great length. Strict return precautions discussed and pt &/or family have verbalized understanding of the instructions. No further questions at time of discharge.    New Prescriptions   DOXYCYCLINE (VIBRAMYCIN) 100 MG CAPSULE    Take 1 capsule (100 mg total) by mouth 2 (two) times daily.   ONDANSETRON (ZOFRAN) 4 MG TABLET    Take 1 tablet (4 mg total) by mouth every 8 (eight) hours as needed for nausea or vomiting.   OXYCODONE-ACETAMINOPHEN (PERCOCET/ROXICET) 5-325 MG TABLET    Take 1 tablet by mouth every 4 (four) hours as needed for severe pain.    Follow Up: [facility] [address] [place] [phone] Schedule an appointment as soon as possible for a visit    [facility] [address] [id number] [place] [id number]        Jamarkus Lisbon, Canary Brimhristopher J, MD 03/09/22 2203

## 2022-03-09 NOTE — Discharge Instructions (Signed)
Your history, exam, work-up today are consistent with epididymitis causing your groin symptoms.  This is similar to what you had in the past.  We treated you for possible infections but please follow-up on the results otherwise.  Your ultrasound did not show evidence of torsion.  Please rest and stay hydrated and use the medications.  If any symptoms change or worsen acutely, please return to the nearest emergency department.

## 2022-03-09 NOTE — ED Triage Notes (Signed)
Pt c/o two days of lower abdominal pain and L testicular swelling. Pt denies N/V/D, penile discharge, odor, burning with urination. Pt states that pain is similar to when he was diagnosed with syphilis two years ago, but states he has had no exposure.

## 2022-03-10 LAB — RPR
RPR Ser Ql: REACTIVE — AB
RPR Titer: 1:1 {titer}

## 2022-03-11 LAB — T.PALLIDUM AB, TOTAL: T Pallidum Abs: REACTIVE — AB

## 2022-03-11 LAB — GC/CHLAMYDIA PROBE AMP (~~LOC~~) NOT AT ARMC
Chlamydia: NEGATIVE
Comment: NEGATIVE
Comment: NORMAL
Neisseria Gonorrhea: NEGATIVE

## 2022-03-11 LAB — URINE CULTURE: Culture: NO GROWTH

## 2022-03-13 ENCOUNTER — Ambulatory Visit: Payer: Self-pay | Admitting: Pharmacist

## 2022-11-22 ENCOUNTER — Emergency Department (HOSPITAL_COMMUNITY)
Admission: EM | Admit: 2022-11-22 | Discharge: 2022-11-22 | Disposition: A | Discharge status: Home / Self Care | Payer: Medicaid Other | Attending: Emergency Medicine | Admitting: Emergency Medicine

## 2022-11-22 ENCOUNTER — Other Ambulatory Visit: Payer: Self-pay

## 2022-11-22 ENCOUNTER — Encounter (HOSPITAL_COMMUNITY): Payer: Self-pay

## 2022-11-22 DIAGNOSIS — Z20822 Contact with and (suspected) exposure to covid-19: Secondary | ICD-10-CM | POA: Insufficient documentation

## 2022-11-22 DIAGNOSIS — R369 Urethral discharge, unspecified: Secondary | ICD-10-CM | POA: Insufficient documentation

## 2022-11-22 DIAGNOSIS — N342 Other urethritis: Secondary | ICD-10-CM

## 2022-11-22 DIAGNOSIS — R109 Unspecified abdominal pain: Secondary | ICD-10-CM | POA: Insufficient documentation

## 2022-11-22 LAB — URINALYSIS, ROUTINE W REFLEX MICROSCOPIC
Bilirubin Urine: NEGATIVE
Glucose, UA: NEGATIVE mg/dL
Ketones, ur: NEGATIVE mg/dL
Nitrite: NEGATIVE
Protein, ur: NEGATIVE mg/dL
Specific Gravity, Urine: 1.023 (ref 1.005–1.030)
WBC, UA: >50 WBC/hpf (ref 0–5)
pH: 5 (ref 5.0–8.0)

## 2022-11-22 LAB — CBC WITH DIFFERENTIAL/PLATELET
Abs Immature Granulocytes: 0.01 10*3/uL (ref 0.00–0.07)
Basophils Absolute: 0 10*3/uL (ref 0.0–0.1)
Basophils Relative: 1 %
Eosinophils Absolute: 0.1 10*3/uL (ref 0.0–0.5)
Eosinophils Relative: 1 %
HCT: 42.7 % (ref 39.0–52.0)
Hemoglobin: 13.8 g/dL (ref 13.0–17.0)
Immature Granulocytes: 0 %
Lymphocytes Relative: 47 %
Lymphs Abs: 2.7 10*3/uL (ref 0.7–4.0)
MCH: 27.8 pg (ref 26.0–34.0)
MCHC: 32.3 g/dL (ref 30.0–36.0)
MCV: 86.1 fL (ref 80.0–100.0)
Monocytes Absolute: 0.5 10*3/uL (ref 0.1–1.0)
Monocytes Relative: 9 %
Neutro Abs: 2.5 10*3/uL (ref 1.7–7.7)
Neutrophils Relative %: 42 %
Platelets: 188 10*3/uL (ref 150–400)
RBC: 4.96 MIL/uL (ref 4.22–5.81)
RDW: 14.2 % (ref 11.5–15.5)
WBC: 5.8 10*3/uL (ref 4.0–10.5)
nRBC: 0 % (ref 0.0–0.2)

## 2022-11-22 LAB — COMPREHENSIVE METABOLIC PANEL
ALT: 18 U/L (ref 0–44)
AST: 26 U/L (ref 15–41)
Albumin: 3.1 g/dL — ABNORMAL LOW (ref 3.5–5.0)
Alkaline Phosphatase: 70 U/L (ref 38–126)
Anion gap: 6 (ref 5–15)
BUN: 14 mg/dL (ref 6–20)
CO2: 24 mmol/L (ref 22–32)
Calcium: 8.7 mg/dL — ABNORMAL LOW (ref 8.9–10.3)
Chloride: 104 mmol/L (ref 98–111)
Creatinine, Ser: 0.94 mg/dL (ref 0.61–1.24)
GFR, Estimated: >60 mL/min (ref >60)
Glucose, Bld: 91 mg/dL (ref 70–99)
Potassium: 3.7 mmol/L (ref 3.5–5.1)
Sodium: 134 mmol/L — ABNORMAL LOW (ref 135–145)
Total Bilirubin: 0.7 mg/dL (ref 0.3–1.2)
Total Protein: 9 g/dL — ABNORMAL HIGH (ref 6.5–8.1)

## 2022-11-22 LAB — RPR
RPR Ser Ql: REACTIVE — AB
RPR Titer: 1:1 {titer}

## 2022-11-22 LAB — RESP PANEL BY RT-PCR (RSV, FLU A&B, COVID)  RVPGX2
Influenza A by PCR: NEGATIVE
Influenza B by PCR: NEGATIVE
Resp Syncytial Virus by PCR: NEGATIVE
SARS Coronavirus 2 by RT PCR: NEGATIVE

## 2022-11-22 LAB — LIPASE, BLOOD: Lipase: 61 U/L — ABNORMAL HIGH (ref 11–51)

## 2022-11-22 MED ORDER — DOXYCYCLINE HYCLATE 100 MG PO TABS
100.0000 mg | ORAL_TABLET | Freq: Once | ORAL | Status: AC
Start: 2022-11-22 — End: 2022-11-22
  Administered 2022-11-22: 100 mg via ORAL
  Filled 2022-11-22: qty 1

## 2022-11-22 MED ORDER — ONDANSETRON 8 MG PO TBDP: 8.0000 mg | ORAL_TABLET | Freq: Three times a day (TID) | ORAL | 0 refills | Status: DC | PRN

## 2022-11-22 MED ORDER — CEFTRIAXONE SODIUM 500 MG IJ SOLR
500.0000 mg | Freq: Once | INTRAMUSCULAR | Status: AC
  Administered 2022-11-22: 500 mg via INTRAMUSCULAR
  Filled 2022-11-22: qty 500

## 2022-11-22 MED ORDER — KETOROLAC TROMETHAMINE 15 MG/ML IJ SOLN
15.0000 mg | Freq: Once | INTRAMUSCULAR | Status: AC
  Administered 2022-11-22: 15 mg via INTRAVENOUS
  Filled 2022-11-22: qty 1

## 2022-11-22 MED ORDER — DOXYCYCLINE HYCLATE 100 MG PO CAPS: 100.0000 mg | ORAL_CAPSULE | Freq: Two times a day (BID) | ORAL | 0 refills | Status: DC

## 2022-11-22 NOTE — ED Provider Notes (Signed)
[facility] Provider Note   CSN: [id number] Arrival date & time: 11/22/22  [id number]     History  Chief Complaint  Patient presents with   Abdominal Pain    Travis Herring is a 37 y.o. male.   Abdominal Pain    This is a 37 year old male with history of unstable housing, cocaine use, HIV presenting to the emergency department due to abdominal pain and penile discharge.  The abdominal pain for about 2 weeks, associated with nausea but no emesis.  He has had intermittent diarrhea and constipation.  States he was also exposed to COVID a week ago by his employer.  He states for the last day he has been having clear penile discharge, denies any sexual activity for the last 3 months.  He is HIV positive but has not been to take this medicine for the last 2 months due to difficulty arranging follow-up while working.  He is status post appendectomy, denies any other abdominal surgeries.    Home Medications Prior to Admission medications   Medication Sig Start Date End Date Taking? Authorizing Provider  bictegravir-emtricitabine-tenofovir AF (BIKTARVY) 50-200-25 MG TABS tablet Take 1 tablet by mouth daily. Patient not taking: Reported on 12/26/2020 12/12/19   Tommy Medal, Lavell Islam, MD  dicyclomine (BENTYL) 20 MG tablet Take 1 tablet (20 mg total) by mouth 2 (two) times daily. 03/06/20   Khatri, Hina, PA-C  doxycycline (VIBRAMYCIN) 100 MG capsule Take 1 capsule (100 mg total) by mouth 2 (two) times daily. 03/09/22   Tegeler, Gwenyth Allegra, MD  Fluticasone-Salmeterol (ADVAIR DISKUS) 100-50 MCG/DOSE AEPB Inhale 1 puff into the lungs 2 (two) times daily. Patient taking differently: Inhale 1 puff into the lungs daily as needed (sob).  09/20/19   Truman Hayward, MD  HYDROcodone-acetaminophen (NORCO/VICODIN) 5-325 MG tablet Take 1 tablet by mouth every 6 (six) hours as needed for severe pain. 10/12/21   Ripley Fraise, MD  ondansetron (ZOFRAN) 4 MG tablet Take  1 tablet (4 mg total) by mouth every 8 (eight) hours as needed for nausea or vomiting. 03/09/22   Tegeler, Gwenyth Allegra, MD  oxyCODONE-acetaminophen (PERCOCET/ROXICET) 5-325 MG tablet Take 1 tablet by mouth every 4 (four) hours as needed for severe pain. 03/09/22   Tegeler, Gwenyth Allegra, MD  sulfamethoxazole-trimethoprim (BACTRIM DS) 800-160 MG tablet Take 1 tablet by mouth 2 (two) times daily. Patient not taking: Reported on 07/25/2020 05/13/20   Rolland Porter, MD      Allergies    Aspirin    Review of Systems   Review of Systems  Gastrointestinal:  Positive for abdominal pain.    Physical Exam Updated Vital Signs BP (!) 147/108 (BP Location: Right Arm)   Pulse 84   Temp 98.2 F (36.8 C) (Oral)   Resp 16   Ht 5' 11"$  (1.803 m)   Wt 70.3 kg   SpO2 100%   BMI 21.62 kg/m  Physical Exam Vitals and nursing note reviewed. Exam conducted with a chaperone present.  Constitutional:      Appearance: Normal appearance.  HENT:     Head: Normocephalic and atraumatic.  Eyes:     General: No scleral icterus.       Right eye: No discharge.        Left eye: No discharge.     Extraocular Movements: Extraocular movements intact.     Pupils: Pupils are equal, round, and reactive to light.  Cardiovascular:     Rate and Rhythm:  Normal rate and regular rhythm.     Pulses: Normal pulses.     Heart sounds: Normal heart sounds. No murmur heard.    No friction rub. No gallop.  Pulmonary:     Effort: Pulmonary effort is normal. No respiratory distress.     Breath sounds: Normal breath sounds.  Abdominal:     General: Abdomen is flat. Bowel sounds are normal. There is no distension.     Palpations: Abdomen is soft.     Tenderness: There is no abdominal tenderness.     Comments: Abdomen is soft  Genitourinary:    Comments: Genital exam performed with ED tech Jarrett Soho as chaperone in room. clear discharge coming from urethra but no erythema , + inguinal adenopathy, no testicular pain or  swelling. Skin:    General: Skin is warm and dry.     Coloration: Skin is not jaundiced.  Neurological:     Mental Status: He is alert. Mental status is at baseline.     Coordination: Coordination normal.     ED Results / Procedures / Treatments   Labs (all labs ordered are listed, but only abnormal results are displayed) Labs Reviewed  RESP PANEL BY RT-PCR (RSV, FLU A&B, COVID)  [id number]  URINALYSIS, ROUTINE W REFLEX MICROSCOPIC  CBC WITH DIFFERENTIAL/PLATELET  COMPREHENSIVE METABOLIC PANEL  LIPASE, BLOOD  RPR  GC/CHLAMYDIA PROBE AMP ([place]) NOT AT [facility]    EKG None  Radiology No results found.  Procedures Procedures    Medications Ordered in ED Medications - No data to display  ED Course/ Medical Decision Making/ A&P Clinical Course as of 11/22/22 0858  Sat Nov 22, 2022  0842 CBC with Differential No leukocytosis underlying anemia [HS]  0851 Urinalysis, Routine w reflex microscopic -Urine, Clean Catch(!) Leukocyturia, GC and committee are pending.  Will cover with Rocephin and doxycycline. [HS]  0857 Lipase, blood(!) Mildly elevated at 61, there is no epigastric tenderness, presentation is not consistent with pancreatitis and this is not 3 times greater than the upper limit of normal for lipase. [HS]  [id number] Comprehensive metabolic panel(!) No gross electrolyte derangement, AKI or transaminitis [HS]    Clinical Course User Index [HS] Sherrill Raring, PA-C                             Medical Decision Making Amount and/or Complexity of Data Reviewed Labs: ordered. Decision-making details documented in ED Course.  Risk Prescription drug management.   This is a 37 year old male presenting to the emergency department due to abdominal pain and penile discharge.  Differential includes urethritis, STD, UTI, pyelonephritis, pancreatitis, colitis.  On exam patient's abdomen is soft and nontender.  He does have clear discharge coming from the urethra but no  surrounding erythema, I do not appreciate any abscess there is no testicular pain or swelling.  Not consistent with a torsion.  He is afebrile, abdomen is nonperitoneal.  He does not appear septic.  Will check labs including STD panel.  Ordered Toradol for pain.  I reevaluated patient, repeat abdominal exam is benign. Patient has clear leukocyturia, will cover with Rocephin and doxycycline for suspected urethritis.  I do not think he needs any imaging of his abdomen.         Final Clinical Impression(s) / ED Diagnoses Final diagnoses:  None    Rx / DC Orders ED Discharge Orders     None         Amilah Greenspan,  Hildred Alamin, PA-C 11/22/22 [id number]    Wyvonnia Dusky, MD 11/22/22 1041

## 2022-11-22 NOTE — ED Notes (Signed)
AVS reviewed with pt prior to discharge. Pt verbalizes understanding. Belongings with pt upon depart. Work note provided. Ambulatory to POV by self

## 2022-11-22 NOTE — ED Triage Notes (Signed)
Reports exposure to covid last week but 2 days ago started having abd pain and nausea.  Reports having chills and feeling weak.  Patient reports he woke up this morning with a clear discharge from penis.

## 2022-11-22 NOTE — Discharge Instructions (Addendum)
Take the doxycycline twice daily for the next 10 days.  You were treated today for urethritis with 1 shot of Rocephin, usually take the doxycycline pill.  Follow-up with your infectious disease clinic this week for reevaluation.  Return to the ED for new or concerning symptoms.

## 2022-11-24 LAB — T.PALLIDUM AB, TOTAL: T Pallidum Abs: REACTIVE — AB

## 2023-01-01 IMAGING — US US SCROTUM W/ DOPPLER COMPLETE
1 series · 13 of 25 positions shown · non-contrast
Comparison: None.

CLINICAL DATA: Left testicular pain and scrotal swelling. Symptoms
for 2 days.

EXAM:
SCROTAL ULTRASOUND
DOPPLER ULTRASOUND OF THE TESTICLES
TECHNIQUE: Complete ultrasound examination of the testicles, epididymis, and
other scrotal structures was performed. Color and spectral Doppler
ultrasound were also utilized to evaluate blood flow to the
testicles.

[Series 1: us scrotum w/ doppler complete · 13 of 80 slices shown]
[im 1/80]
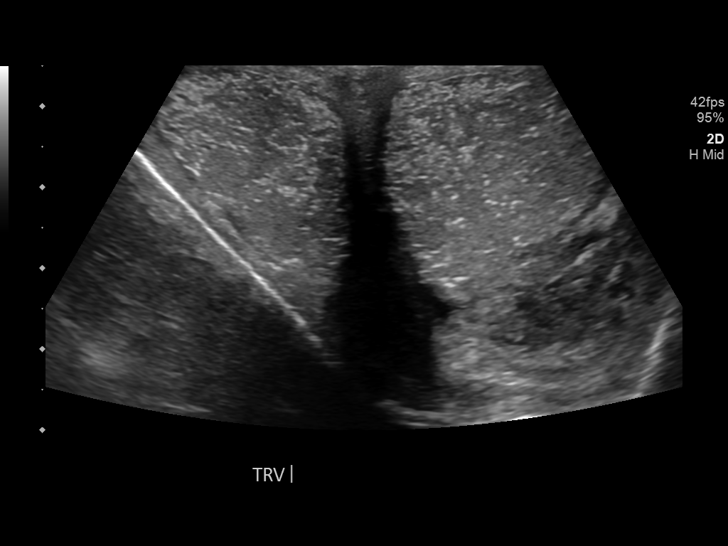
[im 7/80]
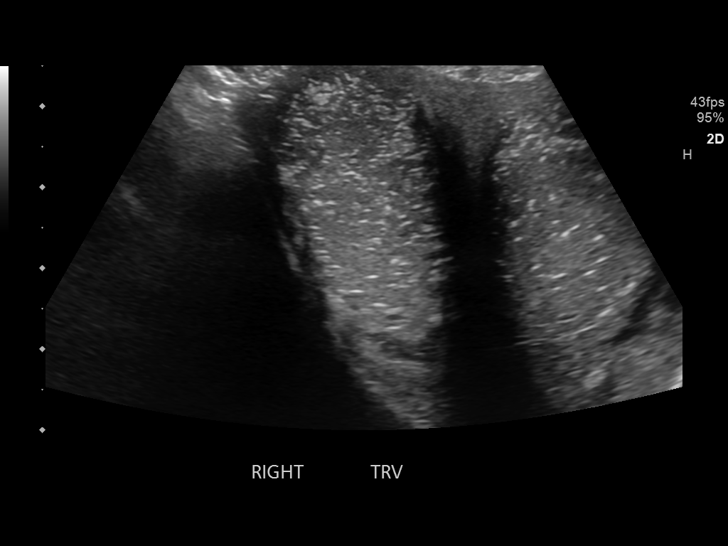
[im 14/80]
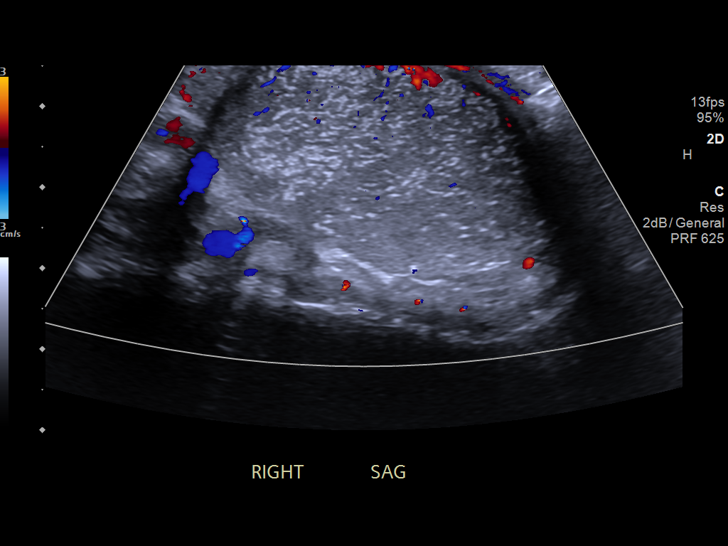
[im 20/80]
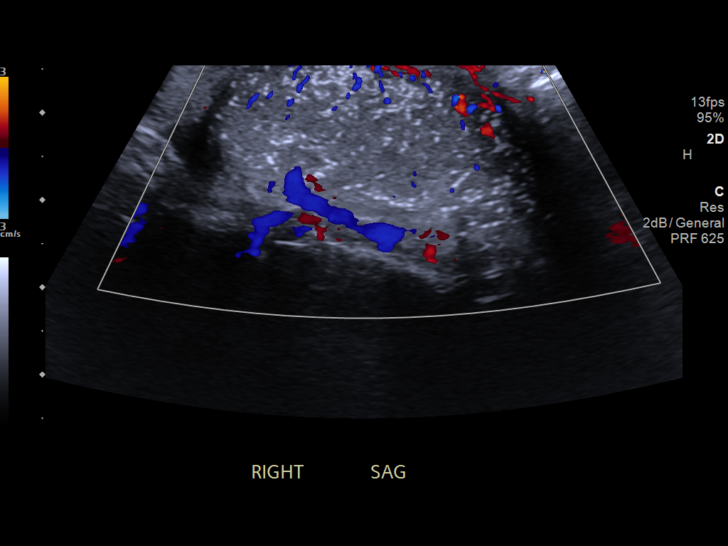
[im 27/80]
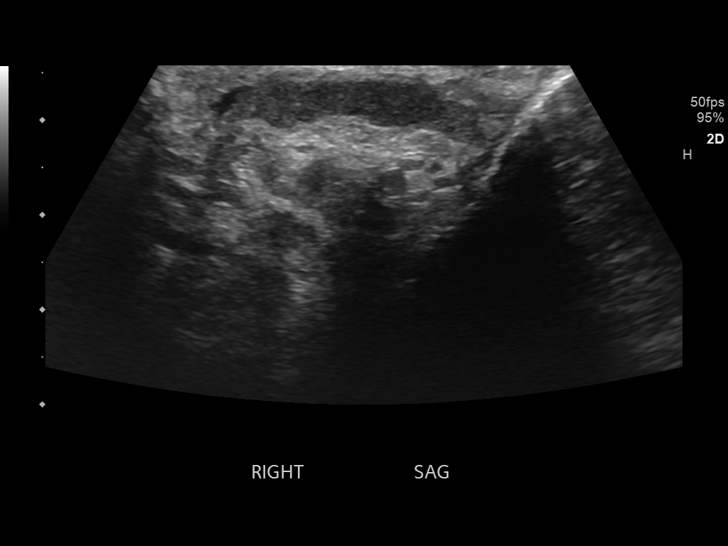
[im 33/80]
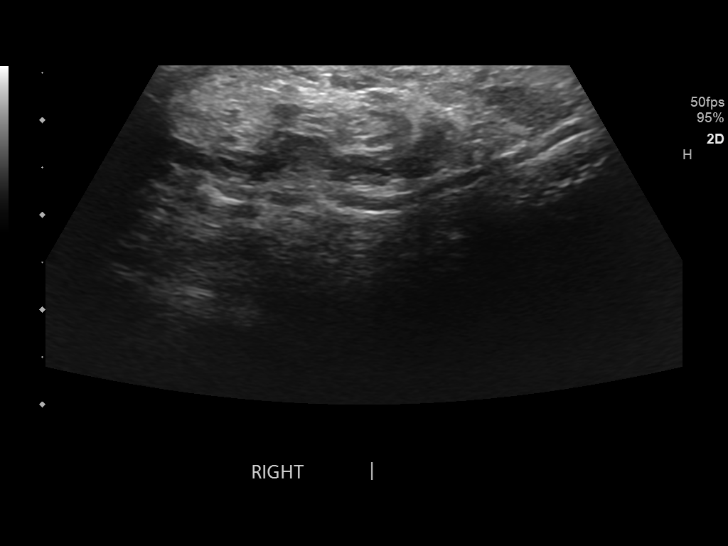
[im 40/80]
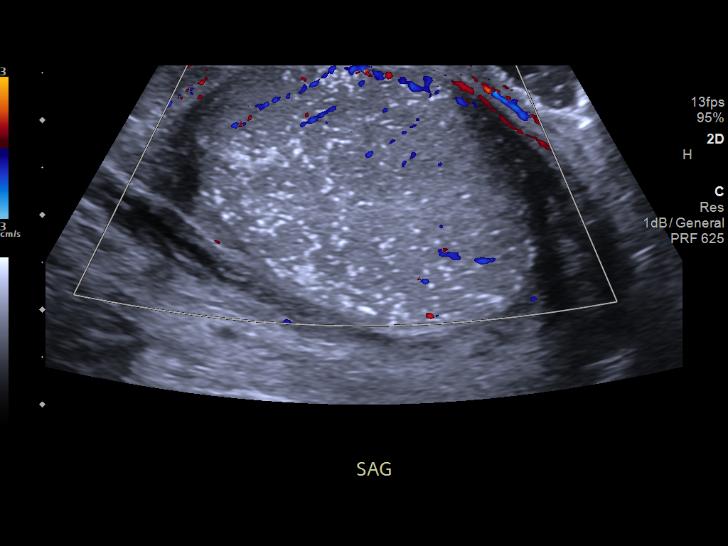
[im 47/80]
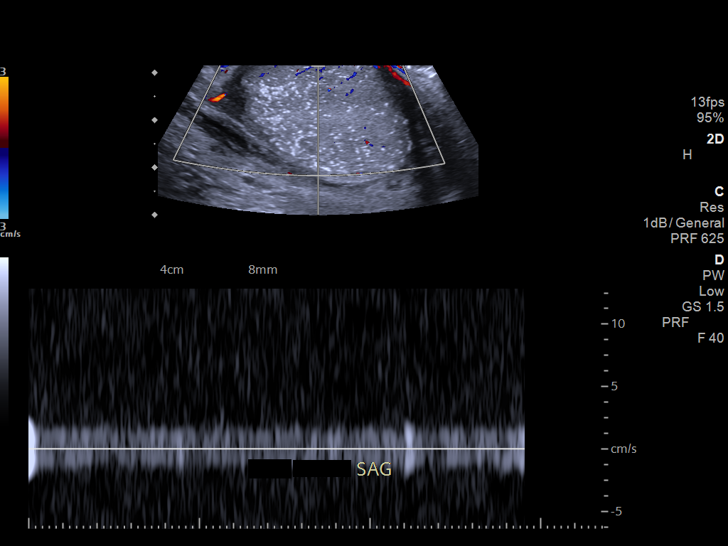
[im 53/80]
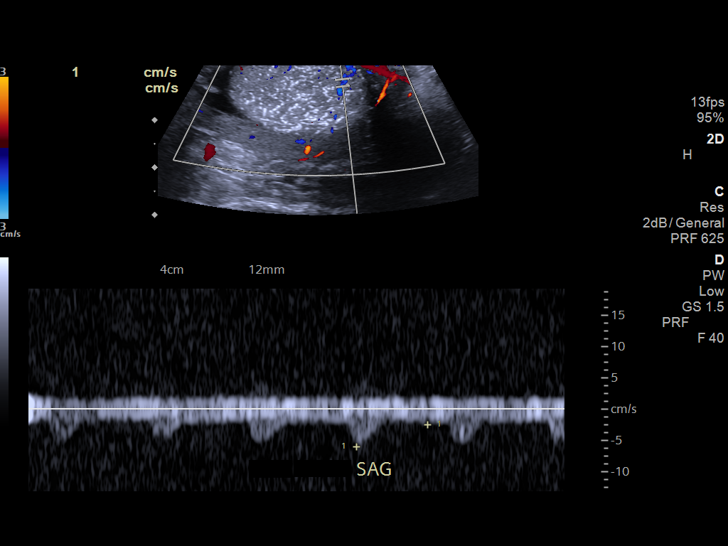
[im 60/80]
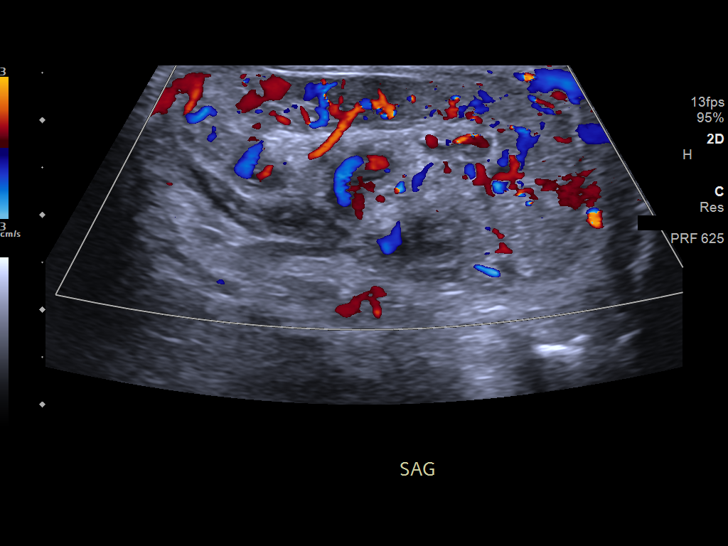
[im 66/80]
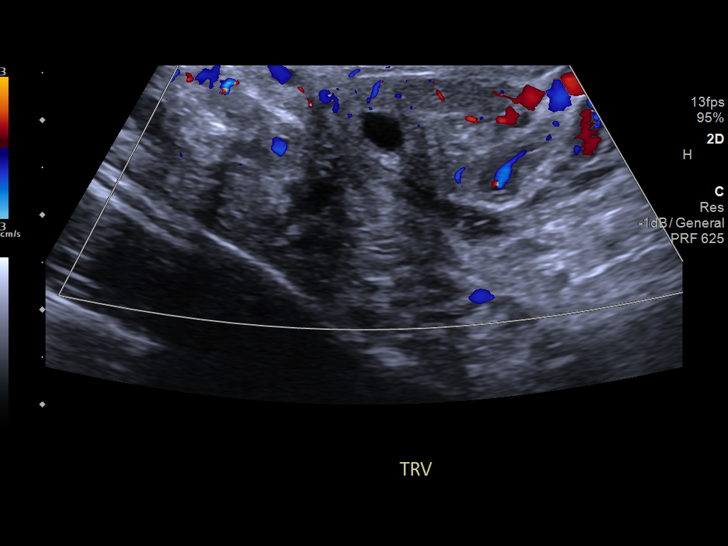
[im 73/80]
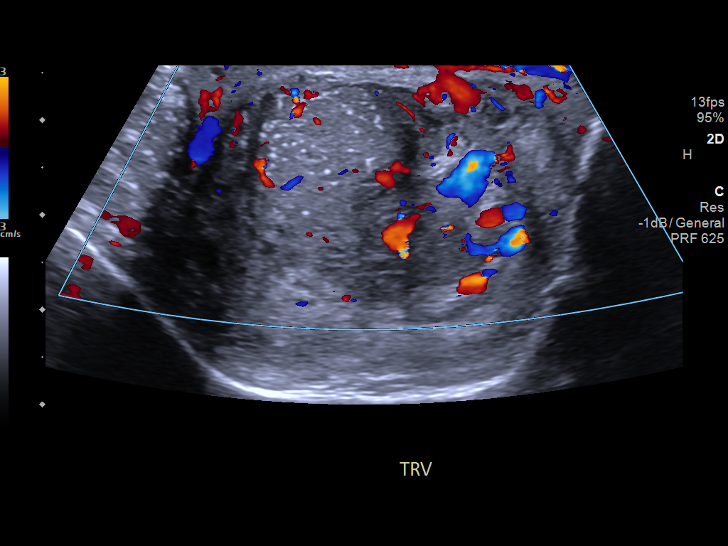
[im 80/80]
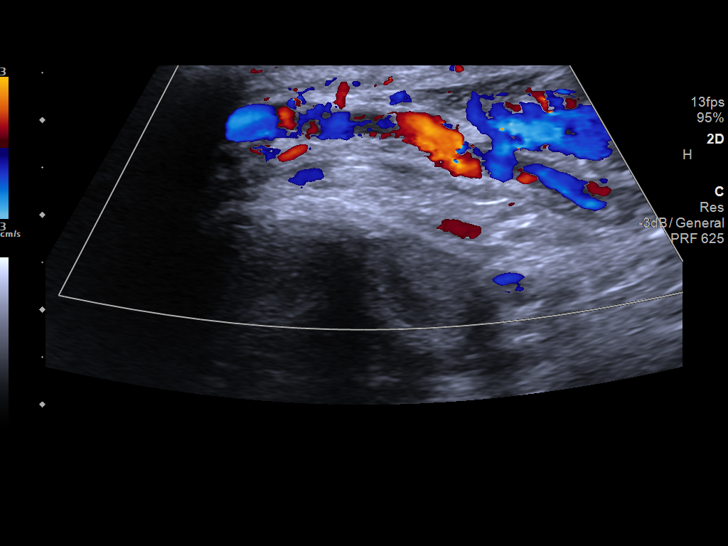

[13 of 25 positions shown; findings below may reference images not displayed]

FINDINGS: Right testicle

Measurements: 4.6 x 2.5 x 2.4 cm. Testicular echogenicity is
diffusely heterogeneous with multiple echogenic foci. No definite
posterior shadowing to suggest microlithiasis. No focal testicular
mass. Blood flow is noted.

Left testicle

Measurements: 4.0 x 2.6 x 2.7 cm. Testicular echogenicity is
diffusely heterogeneous with multiple echogenic foci. No definite
posterior shadowing to suggest microlithiasis. No focal testicular
mass. Blood flow is noted.

Right epididymis:  Normal in size and appearance.

Left epididymis: Enlarged with diffusely increased vascularity.
Incidental 5 minimal epididymal head cyst.

Hydrocele:  Small on the left.

Varicocele:  None visualized.

Pulsed Doppler interrogation of both testes demonstrates normal low
resistance arterial and venous waveforms bilaterally.
IMPRESSION: 1. Enlarged hypervascular left epididymis most consistent with
epididymitis.
2. Diffusely heterogeneous testicular parenchyma of both testis with
multiple echogenic foci. No definite posterior shadowing to suggest
testicular microlithiasis. Suspect systemic causes the chest sequela
of prior infection or inflammation.
3. Normal blood flow to both testis without evidence of torsion.

## 2023-08-30 ENCOUNTER — Other Ambulatory Visit (HOSPITAL_COMMUNITY): Payer: Medicaid Other

## 2023-08-30 ENCOUNTER — Other Ambulatory Visit: Payer: Self-pay

## 2023-08-30 ENCOUNTER — Encounter (HOSPITAL_COMMUNITY): Payer: Self-pay

## 2023-08-30 ENCOUNTER — Emergency Department (HOSPITAL_COMMUNITY)
Admission: EM | Admit: 2023-08-30 | Discharge: 2023-08-30 | Disposition: A | Discharge status: Home / Self Care | Payer: Medicaid Other | Attending: Emergency Medicine | Admitting: Emergency Medicine

## 2023-08-30 ENCOUNTER — Emergency Department (HOSPITAL_COMMUNITY): Payer: Medicaid Other

## 2023-08-30 DIAGNOSIS — F10988 Alcohol use, unspecified with other alcohol-induced disorder: Secondary | ICD-10-CM | POA: Insufficient documentation

## 2023-08-30 DIAGNOSIS — K852 Alcohol induced acute pancreatitis without necrosis or infection: Secondary | ICD-10-CM | POA: Insufficient documentation

## 2023-08-30 DIAGNOSIS — A5619 Other chlamydial genitourinary infection: Secondary | ICD-10-CM | POA: Diagnosis not present

## 2023-08-30 DIAGNOSIS — B20 Human immunodeficiency virus [HIV] disease: Secondary | ICD-10-CM | POA: Diagnosis not present

## 2023-08-30 DIAGNOSIS — Z9049 Acquired absence of other specified parts of digestive tract: Secondary | ICD-10-CM | POA: Insufficient documentation

## 2023-08-30 DIAGNOSIS — N50812 Left testicular pain: Secondary | ICD-10-CM | POA: Diagnosis present

## 2023-08-30 DIAGNOSIS — R030 Elevated blood-pressure reading, without diagnosis of hypertension: Secondary | ICD-10-CM | POA: Insufficient documentation

## 2023-08-30 DIAGNOSIS — N451 Epididymitis: Secondary | ICD-10-CM

## 2023-08-30 LAB — CBC
HCT: 38.6 % — ABNORMAL LOW (ref 39.0–52.0)
Hemoglobin: 12.3 g/dL — ABNORMAL LOW (ref 13.0–17.0)
MCH: 28.5 pg (ref 26.0–34.0)
MCHC: 31.9 g/dL (ref 30.0–36.0)
MCV: 89.6 fL (ref 80.0–100.0)
Platelets: 218 10*3/uL (ref 150–400)
RBC: 4.31 MIL/uL (ref 4.22–5.81)
RDW: 14.6 % (ref 11.5–15.5)
WBC: 5.7 10*3/uL (ref 4.0–10.5)
nRBC: 0 % (ref 0.0–0.2)

## 2023-08-30 LAB — URINALYSIS, ROUTINE W REFLEX MICROSCOPIC
Bacteria, UA: NONE SEEN
Bilirubin Urine: NEGATIVE
Glucose, UA: NEGATIVE mg/dL
Ketones, ur: NEGATIVE mg/dL
Leukocytes,Ua: NEGATIVE
Nitrite: NEGATIVE
Protein, ur: NEGATIVE mg/dL
Specific Gravity, Urine: 1.014 (ref 1.005–1.030)
pH: 5 (ref 5.0–8.0)

## 2023-08-30 LAB — COMPREHENSIVE METABOLIC PANEL
ALT: 24 U/L (ref 0–44)
AST: 21 U/L (ref 15–41)
Albumin: 2.8 g/dL — ABNORMAL LOW (ref 3.5–5.0)
Alkaline Phosphatase: 62 U/L (ref 38–126)
Anion gap: 3 — ABNORMAL LOW (ref 5–15)
BUN: 7 mg/dL (ref 6–20)
CO2: 25 mmol/L (ref 22–32)
Calcium: 8.4 mg/dL — ABNORMAL LOW (ref 8.9–10.3)
Chloride: 109 mmol/L (ref 98–111)
Creatinine, Ser: 0.8 mg/dL (ref 0.61–1.24)
GFR, Estimated: >60 mL/min (ref >60)
Glucose, Bld: 137 mg/dL — ABNORMAL HIGH (ref 70–99)
Potassium: 4 mmol/L (ref 3.5–5.1)
Sodium: 137 mmol/L (ref 135–145)
Total Bilirubin: 0.3 mg/dL (ref <1.2)
Total Protein: 8.9 g/dL — ABNORMAL HIGH (ref 6.5–8.1)

## 2023-08-30 LAB — LIPASE, BLOOD: Lipase: 74 U/L — ABNORMAL HIGH (ref 11–51)

## 2023-08-30 MED ORDER — MORPHINE SULFATE (PF) 4 MG/ML IV SOLN
4.0000 mg | Freq: Once | INTRAVENOUS | Status: AC
  Administered 2023-08-30: 4 mg via INTRAVENOUS
  Filled 2023-08-30: qty 1

## 2023-08-30 MED ORDER — DOXYCYCLINE HYCLATE 100 MG PO TABS: 100.0000 mg | ORAL_TABLET | Freq: Two times a day (BID) | ORAL | 0 refills | Status: DC

## 2023-08-30 MED ORDER — CEFTRIAXONE SODIUM 500 MG IJ SOLR
500.0000 mg | Freq: Once | INTRAMUSCULAR | Status: AC
  Administered 2023-08-30: 500 mg via INTRAMUSCULAR
  Filled 2023-08-30: qty 500

## 2023-08-30 MED ORDER — LIDOCAINE HCL (PF) 1 % IJ SOLN
1.0000 mL | Freq: Once | INTRAMUSCULAR | Status: AC
Start: 2023-08-30 — End: 2023-08-30
  Administered 2023-08-30: 1 mL
  Filled 2023-08-30: qty 5

## 2023-08-30 MED ORDER — SODIUM CHLORIDE 0.9 % IV BOLUS
1000.0000 mL | Freq: Once | INTRAVENOUS | Status: AC
  Administered 2023-08-30: 1000 mL via INTRAVENOUS

## 2023-08-30 MED ORDER — OXYCODONE-ACETAMINOPHEN 5-325 MG PO TABS
1.0000 | ORAL_TABLET | Freq: Once | ORAL | Status: AC
  Administered 2023-08-30: 1 via ORAL
  Filled 2023-08-30: qty 1

## 2023-08-30 MED ORDER — ONDANSETRON HCL 4 MG PO TABS: 4.0000 mg | ORAL_TABLET | Freq: Four times a day (QID) | ORAL | 0 refills | Status: DC

## 2023-08-30 MED ORDER — OXYCODONE-ACETAMINOPHEN 5-325 MG PO TABS: 1.0000 | ORAL_TABLET | Freq: Four times a day (QID) | ORAL | 0 refills | Status: DC | PRN

## 2023-08-30 MED ORDER — ONDANSETRON 4 MG PO TBDP
4.0000 mg | ORAL_TABLET | Freq: Once | ORAL | Status: AC
  Administered 2023-08-30: 4 mg via ORAL
  Filled 2023-08-30: qty 1

## 2023-08-30 NOTE — ED Notes (Signed)
Food given per order of the pa

## 2023-08-30 NOTE — ED Provider Notes (Signed)
[facility] Provider Note   CSN: [id number] Arrival date & time: 08/30/23  1121     History  Chief Complaint  Patient presents with   Abdominal Pain   Testicle Pain    Travis Herring is a 37 y.o. male Pt complains of central abdominal pain as well as left testicle pain. Patient with history of HIV, cocaine use, alcohol use. Reports left testicle swollen, painful for 4 days. He does report sexually active with multiple partners but denies any penile discharge.    Abdominal Pain Testicle Pain Associated symptoms include abdominal pain.       Home Medications Prior to Admission medications   Medication Sig Start Date End Date Taking? Authorizing Provider  doxycycline (VIBRA-TABS) 100 MG tablet Take 1 tablet (100 mg total) by mouth 2 (two) times daily. 08/30/23  Yes Chidera Dearcos H, PA-C  ondansetron (ZOFRAN) 4 MG tablet Take 1 tablet (4 mg total) by mouth every 6 (six) hours. 08/30/23  Yes Shamere Campas H, PA-C  oxyCODONE-acetaminophen (PERCOCET/ROXICET) 5-325 MG tablet Take 1 tablet by mouth every 6 (six) hours as needed for severe pain (pain score 7-10). 08/30/23  Yes Azaylea Maves H, PA-C  bictegravir-emtricitabine-tenofovir AF (BIKTARVY) 50-200-25 MG TABS tablet Take 1 tablet by mouth daily. Patient not taking: Reported on 12/26/2020 12/12/19   Daiva Eves, Lisette Grinder, MD      Allergies    Aspirin    Review of Systems   Review of Systems  Gastrointestinal:  Positive for abdominal pain.  Genitourinary:  Positive for testicular pain.  All other systems reviewed and are negative.   Physical Exam Updated Vital Signs BP (!) 156/121   Pulse 82   Temp 98.1 F (36.7 C) (Oral)   Resp 10   Ht 5\' 10"  (1.778 m)   Wt 68 kg   SpO2 100%   BMI 21.52 kg/m  Physical Exam Vitals and nursing note reviewed.  Constitutional:      General: He is not in acute distress.    Appearance: Normal appearance.  HENT:     Head:  Normocephalic and atraumatic.  Eyes:     General:        Right eye: No discharge.        Left eye: No discharge.  Cardiovascular:     Rate and Rhythm: Normal rate and regular rhythm.     Heart sounds: No murmur heard.    No friction rub. No gallop.  Pulmonary:     Effort: Pulmonary effort is normal.     Breath sounds: Normal breath sounds.  Abdominal:     General: Bowel sounds are normal.     Palpations: Abdomen is soft.     Comments: Mild tenderness to palpation epigastric region, no rebound, rigidity, guarding, normal bowel sounds throughout.  Genitourinary:    Comments:  Patient does have some swelling, induration, and tenderness to the left testicle, but no high riding appearance, intact cremasteric reflex bilaterally. Skin:    General: Skin is warm and dry.     Capillary Refill: Capillary refill takes less than 2 seconds.  Neurological:     Mental Status: He is alert and oriented to person, place, and time.  Psychiatric:        Mood and Affect: Mood normal.        Behavior: Behavior normal.     ED Results / Procedures / Treatments   Labs (all labs ordered are listed, but only abnormal results are  displayed) Labs Reviewed  LIPASE, BLOOD - Abnormal; Notable for the following components:      Result Value   Lipase 74 (*)    All other components within normal limits  COMPREHENSIVE METABOLIC PANEL - Abnormal; Notable for the following components:   Glucose, Bld 137 (*)    Calcium 8.4 (*)    Total Protein 8.9 (*)    Albumin 2.8 (*)    Anion gap 3 (*)    All other components within normal limits  CBC - Abnormal; Notable for the following components:   Hemoglobin 12.3 (*)    HCT 38.6 (*)    All other components within normal limits  URINALYSIS, ROUTINE W REFLEX MICROSCOPIC - Abnormal; Notable for the following components:   Hgb urine dipstick SMALL (*)    All other components within normal limits  GC/CHLAMYDIA PROBE AMP ([place]) NOT AT [facility]     EKG None  Radiology US SCROTUM W/DOPPLER  Result Date: 08/30/2023 CLINICAL DATA:  Left testicular pain and swelling. EXAM: SCROTAL ULTRASOUND DOPPLER ULTRASOUND OF THE TESTICLES TECHNIQUE: Complete ultrasound examination of the testicles, epididymis, and other scrotal structures was performed. Color and spectral Doppler ultrasound were also utilized to evaluate blood flow to the testicles. COMPARISON:  Scrotal ultrasound 03/09/2022 for similar symptoms. FINDINGS: Right testicle Measurements: 4.5 x 1.8 x 2.7 cm. Diffuse microlithiasis is again noted. No discrete lesions are present. Left testicle Measurements: 4.5 x 1.8 x 2.7 cm. Diffuse microlithiasis is again seen. No discrete lesions are present. Right epididymis:  Normal in size and appearance. Left epididymis: The left epididymis is enlarged as has been seen on prior studies. The epididymis is hyperemic. No discrete abscess is present. Hydrocele:  None visualized. Varicocele:  None visualized. Pulsed Doppler interrogation of both testes demonstrates normal low resistance arterial and venous waveforms bilaterally. IMPRESSION: 1. Enlarged and hyperemic left epididymis compatible with epididymitis. 2. Diffuse microlithiasis of both testicles. 3. No evidence for testicular torsion. Electronically Signed   By: Marin Roberts M.D.   On: 08/30/2023 14:12    Procedures Procedures    Medications Ordered in ED Medications  oxyCODONE-acetaminophen (PERCOCET/ROXICET) 5-325 MG per tablet 1 tablet (1 tablet Oral Given 08/30/23 1246)  ondansetron (ZOFRAN-ODT) disintegrating tablet 4 mg (4 mg Oral Given 08/30/23 1246)  morphine (PF) 4 MG/ML injection 4 mg (4 mg Intravenous Given 08/30/23 1416)  sodium chloride 0.9 % bolus 1,000 mL (0 mLs Intravenous Stopped 08/30/23 1511)  cefTRIAXone (ROCEPHIN) injection 500 mg (500 mg Intramuscular Given 08/30/23 1622)  lidocaine (PF) (XYLOCAINE) 1 % injection 1-2.1 mL (1 mL Other Given 08/30/23 1622)   morphine (PF) 4 MG/ML injection 4 mg (4 mg Intravenous Given 08/30/23 1621)    ED Course/ Medical Decision Making/ A&P                                 Medical Decision Making Amount and/or Complexity of Data Reviewed Labs: ordered. Radiology: ordered.  Risk Prescription drug management.   This patient is a 37 y.o. male  who presents to the ED for concern of testicle pain, abdominal pain.   Differential diagnoses prior to evaluation: The emergent differential diagnosis includes, but is not limited to,  The causes of generalized abdominal pain include but are not limited to AAA, mesenteric ischemia, appendicitis, diverticulitis, DKA, gastritis, gastroenteritis, AMI, nephrolithiasis, pancreatitis, peritonitis, adrenal insufficiency,lead poisoning, iron toxicity, intestinal ischemia, constipation, UTI,SBO/LBO, splenic rupture, biliary disease, IBD, IBS, PUD,  or hepatitis, hydrocele, varicocele, testicular torsion, epididymitis, epididymal appendage injury, inguinal hernia, traumatic injury of testicle, STI, inguinal lymphadenopathy versus other . This is not an exhaustive differential.   Past Medical History / Co-morbidities / Social History: HIV, cocaine use, previous appendectomy  Additional history: Chart reviewed. Pertinent results include: Reviewed lab work, imaging from previous emergency department visits  Physical Exam: Physical exam performed. The pertinent findings include: Mild tenderness to palpation epigastric region, no rebound, rigidity, guarding, normal bowel sounds throughout.  Patient does have some swelling, induration, and tenderness to the left testicle, but no high riding appearance, intact cremasteric reflex bilaterally.  Somewhat hypertensive in the emergency department, blood pressure 151/103, otherwise vital signs stable  Lab Tests/Imaging studies: I personally interpreted labs/imaging and the pertinent results include: CMP overall unremarkable, lipase  elevated at 74, while not greater than 3 times the upper limit of normal, patient's physical exam does suggest probable early acute pancreatitis.  GC chlamydia probe is pending.  UA unremarkable.  I independently interpreted scrotum ultrasound which shows epididymitis on the left, will treat for likely sexually transmitted pathology based on patient history. I agree with the radiologist interpretation.    Medications: I ordered medication including morphine for pain, Rocephin for presumed sexually-transmitted etiology of epididymitis, will discharge with doxycycline, as well as short prescription of Percocet for presumed early pancreatitis.  I have reviewed the patients home medicines and have made adjustments as needed.   Disposition: After consideration of the diagnostic results and the patients response to treatment, I feel that patient stable for discharge, signs and symptoms of early pancreatitis, as well as left-sided epididymitis, discussed return precautions and encouraged close follow-up for resolution of symptoms.   emergency department workup does not suggest an emergent condition requiring admission or immediate intervention beyond what has been performed at this time. The plan is: as above. The patient is safe for discharge and has been instructed to return immediately for worsening symptoms, change in symptoms or any other concerns.  Final Clinical Impression(s) / ED Diagnoses Final diagnoses:  Epididymitis  Alcohol-induced acute pancreatitis, unspecified complication status    Rx / DC Orders ED Discharge Orders          Ordered    doxycycline (VIBRA-TABS) 100 MG tablet  2 times daily        08/30/23 1602    oxyCODONE-acetaminophen (PERCOCET/ROXICET) 5-325 MG tablet  Every 6 hours PRN        08/30/23 1607    ondansetron (ZOFRAN) 4 MG tablet  Every 6 hours        08/30/23 1607              Judd Mccubbin, [place], PA-C 08/30/23 1952    Bethann Berkshire, MD 09/01/23  1358

## 2023-08-30 NOTE — ED Triage Notes (Signed)
Pt came to ED for abd pain and left testicle pain. Pt states testicle is swollen for 4 days. Pt denies discharge and painful urination. Axox4. VSS.

## 2023-08-30 NOTE — Discharge Instructions (Addendum)
It seems like you are having 2 issues today: you have a condition called epididymitis which is an infection of the testicles.  We have given you a shot of antibiotics in the emergency department, and you need to complete some oral antibiotics for an additional week.  Additionally your lab work suggest that you have early pancreatitis, I recommend discontinuing drinking alcohol, drinking plenty of fluids.  You can use the pain medication I prescribed as needed in addition to ibuprofen and Tylenol

## 2023-08-30 NOTE — ED Notes (Signed)
Meds given  he has to wait for 30 minutes before he leaves  just to be observed

## 2023-08-30 NOTE — ED Notes (Signed)
Waiting for the [place]

## 2023-08-30 NOTE — ED Provider Triage Note (Signed)
Emergency Medicine Provider Triage Evaluation Note  Travis Herring , a 37 y.o. male  was evaluated in triage.  Pt complains of central abdominal pain as well as left testicle pain.  Patient with history of HIV, cocaine use, alcohol use.  Reports left testicle swollen, painful for 4 days.  He does report sexually active with multiple partners but denies any penile discharge.  Review of Systems  Positive: Abdominal pain, testicle pain Negative: Fever, chills  Physical Exam  BP (!) 163/106 (BP Location: Right Arm)   Pulse 93   Temp 98.1 F (36.7 C)   Resp 18   Ht 5\' 10"  (1.778 m)   Wt 68 kg   SpO2 100%   BMI 21.52 kg/m  Gen:   Awake, no distress   Resp:  Normal effort  MSK:   Moves extremities without difficulty  Other:  Patient does have some swelling, induration, and tenderness to the left testicle, but no high riding appearance, intact cremasteric reflex bilaterally.  Medical Decision Making  Medically screening exam initiated at 12:40 PM.  Appropriate orders placed.  Travis Herring was informed that the remainder of the evaluation will be completed by another provider, this initial triage assessment does not replace that evaluation, and the importance of remaining in the ED until their evaluation is complete.  Workup initiated in triage    Olene Floss, PA-C 08/30/23 1240

## 2023-09-01 LAB — GC/CHLAMYDIA PROBE AMP (~~LOC~~) NOT AT ARMC
Chlamydia: POSITIVE — AB
Comment: NEGATIVE
Comment: NORMAL
Neisseria Gonorrhea: NEGATIVE

## 2023-11-02 ENCOUNTER — Emergency Department (HOSPITAL_COMMUNITY): Payer: Medicaid Other

## 2023-11-02 ENCOUNTER — Emergency Department (HOSPITAL_COMMUNITY)
Admission: EM | Admit: 2023-11-02 | Discharge: 2023-11-02 | Disposition: A | Discharge status: Home / Self Care | Payer: Medicaid Other | Attending: Emergency Medicine | Admitting: Emergency Medicine

## 2023-11-02 ENCOUNTER — Other Ambulatory Visit: Payer: Self-pay

## 2023-11-02 DIAGNOSIS — E871 Hypo-osmolality and hyponatremia: Secondary | ICD-10-CM | POA: Insufficient documentation

## 2023-11-02 DIAGNOSIS — M542 Cervicalgia: Secondary | ICD-10-CM | POA: Diagnosis not present

## 2023-11-02 DIAGNOSIS — E8809 Other disorders of plasma-protein metabolism, not elsewhere classified: Secondary | ICD-10-CM | POA: Diagnosis not present

## 2023-11-02 DIAGNOSIS — R7401 Elevation of levels of liver transaminase levels: Secondary | ICD-10-CM | POA: Insufficient documentation

## 2023-11-02 DIAGNOSIS — S21112A Laceration without foreign body of left front wall of thorax without penetration into thoracic cavity, initial encounter: Secondary | ICD-10-CM | POA: Insufficient documentation

## 2023-11-02 DIAGNOSIS — S299XXA Unspecified injury of thorax, initial encounter: Secondary | ICD-10-CM | POA: Diagnosis present

## 2023-11-02 DIAGNOSIS — Z23 Encounter for immunization: Secondary | ICD-10-CM | POA: Diagnosis not present

## 2023-11-02 DIAGNOSIS — S0083XA Contusion of other part of head, initial encounter: Secondary | ICD-10-CM | POA: Insufficient documentation

## 2023-11-02 LAB — CBC WITH DIFFERENTIAL/PLATELET
Abs Immature Granulocytes: 0.01 10*3/uL (ref 0.00–0.07)
Basophils Absolute: 0 10*3/uL (ref 0.0–0.1)
Basophils Relative: 0 %
Eosinophils Absolute: 0 10*3/uL (ref 0.0–0.5)
Eosinophils Relative: 0 %
HCT: 42.9 % (ref 39.0–52.0)
Hemoglobin: 14 g/dL (ref 13.0–17.0)
Immature Granulocytes: 0 %
Lymphocytes Relative: 39 %
Lymphs Abs: 2.2 10*3/uL (ref 0.7–4.0)
MCH: 28.9 pg (ref 26.0–34.0)
MCHC: 32.6 g/dL (ref 30.0–36.0)
MCV: 88.6 fL (ref 80.0–100.0)
Monocytes Absolute: 0.6 10*3/uL (ref 0.1–1.0)
Monocytes Relative: 10 %
Neutro Abs: 2.8 10*3/uL (ref 1.7–7.7)
Neutrophils Relative %: 51 %
Platelets: 233 10*3/uL (ref 150–400)
RBC: 4.84 MIL/uL (ref 4.22–5.81)
RDW: 13.5 % (ref 11.5–15.5)
WBC: 5.5 10*3/uL (ref 4.0–10.5)
nRBC: 0 % (ref 0.0–0.2)

## 2023-11-02 LAB — COMPREHENSIVE METABOLIC PANEL
ALT: 26 U/L (ref 0–44)
AST: 48 U/L — ABNORMAL HIGH (ref 15–41)
Albumin: 3 g/dL — ABNORMAL LOW (ref 3.5–5.0)
Alkaline Phosphatase: 66 U/L (ref 38–126)
Anion gap: 8 (ref 5–15)
BUN: 7 mg/dL (ref 6–20)
CO2: 24 mmol/L (ref 22–32)
Calcium: 8.9 mg/dL (ref 8.9–10.3)
Chloride: 100 mmol/L (ref 98–111)
Creatinine, Ser: 1.09 mg/dL (ref 0.61–1.24)
GFR, Estimated: >60 mL/min (ref >60)
Glucose, Bld: 100 mg/dL — ABNORMAL HIGH (ref 70–99)
Potassium: 3.7 mmol/L (ref 3.5–5.1)
Sodium: 132 mmol/L — ABNORMAL LOW (ref 135–145)
Total Bilirubin: 0.8 mg/dL (ref 0.0–1.2)
Total Protein: 10 g/dL — ABNORMAL HIGH (ref 6.5–8.1)

## 2023-11-02 LAB — I-STAT CHEM 8, ED
BUN: 7 mg/dL (ref 6–20)
Calcium, Ion: 1.16 mmol/L (ref 1.15–1.40)
Chloride: 102 mmol/L (ref 98–111)
Creatinine, Ser: 1 mg/dL (ref 0.61–1.24)
Glucose, Bld: 100 mg/dL — ABNORMAL HIGH (ref 70–99)
HCT: 44 % (ref 39.0–52.0)
Hemoglobin: 15 g/dL (ref 13.0–17.0)
Potassium: 3.8 mmol/L (ref 3.5–5.1)
Sodium: 139 mmol/L (ref 135–145)
TCO2: 27 mmol/L (ref 22–32)

## 2023-11-02 MED ORDER — MORPHINE SULFATE (PF) 4 MG/ML IV SOLN
4.0000 mg | Freq: Once | INTRAVENOUS | Status: AC
  Administered 2023-11-02: 4 mg via INTRAVENOUS
  Filled 2023-11-02: qty 1

## 2023-11-02 MED ORDER — IOHEXOL 350 MG/ML SOLN
75.0000 mL | Freq: Once | INTRAVENOUS | Status: AC | PRN
  Administered 2023-11-02: 75 mL via INTRAVENOUS

## 2023-11-02 MED ORDER — FENTANYL CITRATE PF 50 MCG/ML IJ SOSY
PREFILLED_SYRINGE | INTRAMUSCULAR | Status: AC
  Filled 2023-11-02: qty 1

## 2023-11-02 MED ORDER — LIDOCAINE-EPINEPHRINE (PF) 2 %-1:200000 IJ SOLN
10.0000 mL | Freq: Once | INTRAMUSCULAR | Status: AC
  Administered 2023-11-02: 10 mL
  Filled 2023-11-02: qty 20

## 2023-11-02 MED ORDER — KETOROLAC TROMETHAMINE 15 MG/ML IJ SOLN
15.0000 mg | Freq: Once | INTRAMUSCULAR | Status: AC
  Administered 2023-11-02: 15 mg via INTRAVENOUS
  Filled 2023-11-02: qty 1

## 2023-11-02 MED ORDER — TETANUS-DIPHTH-ACELL PERTUSSIS 5-2.5-18.5 LF-MCG/0.5 IM SUSY
0.5000 mL | PREFILLED_SYRINGE | Freq: Once | INTRAMUSCULAR | Status: AC
  Administered 2023-11-02: 0.5 mL via INTRAMUSCULAR
  Filled 2023-11-02: qty 0.5

## 2023-11-02 MED ORDER — FENTANYL CITRATE PF 50 MCG/ML IJ SOSY
50.0000 ug | PREFILLED_SYRINGE | Freq: Once | INTRAMUSCULAR | Status: AC
  Administered 2023-11-02: 50 ug via INTRAVENOUS
  Filled 2023-11-02: qty 1
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
## 2023-11-02 NOTE — ED Notes (Signed)
Pt discharged with instruction. Education and need for follow up reinforced.  Pt in no apparent new onset distress at this time.
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
